# Patient Record
Sex: Female | Born: 1945 | Race: Black or African American | Hispanic: No | Marital: Married | Smoking: Current every day smoker
Health system: Southern US, Community
[De-identification: ages and names within clinical notes are randomized; demographics above are authoritative.]

## PROBLEM LIST (undated history)

## (undated) DIAGNOSIS — I10 Essential (primary) hypertension: Secondary | ICD-10-CM

## (undated) DIAGNOSIS — I639 Cerebral infarction, unspecified: Secondary | ICD-10-CM

## (undated) DIAGNOSIS — F419 Anxiety disorder, unspecified: Secondary | ICD-10-CM

## (undated) DIAGNOSIS — J449 Chronic obstructive pulmonary disease, unspecified: Secondary | ICD-10-CM

## (undated) DIAGNOSIS — K219 Gastro-esophageal reflux disease without esophagitis: Secondary | ICD-10-CM

## (undated) DIAGNOSIS — E119 Type 2 diabetes mellitus without complications: Secondary | ICD-10-CM

## (undated) DIAGNOSIS — E079 Disorder of thyroid, unspecified: Secondary | ICD-10-CM

## (undated) DIAGNOSIS — I2510 Atherosclerotic heart disease of native coronary artery without angina pectoris: Secondary | ICD-10-CM

## (undated) HISTORY — PX: CARDIAC SURGERY: SHX584

## (undated) HISTORY — DX: Gastro-esophageal reflux disease without esophagitis: K21.9

## (undated) HISTORY — DX: Disorder of thyroid, unspecified: E07.9

## (undated) HISTORY — DX: Cerebral infarction, unspecified: I63.9

## (undated) HISTORY — DX: Anxiety disorder, unspecified: F41.9

---

## 1997-08-29 ENCOUNTER — Emergency Department (HOSPITAL_COMMUNITY): Admission: EM | Admit: 1997-08-29 | Discharge: 1997-08-29 | Payer: Self-pay | Admitting: *Deleted

## 1997-12-27 ENCOUNTER — Encounter: Payer: Self-pay | Admitting: *Deleted

## 1997-12-27 ENCOUNTER — Ambulatory Visit (HOSPITAL_COMMUNITY): Admission: RE | Admit: 1997-12-27 | Discharge: 1997-12-27 | Payer: Self-pay | Admitting: *Deleted

## 1998-06-08 ENCOUNTER — Ambulatory Visit (HOSPITAL_COMMUNITY): Admission: RE | Admit: 1998-06-08 | Discharge: 1998-06-08 | Payer: Self-pay | Admitting: Cardiology

## 1998-06-08 ENCOUNTER — Encounter: Payer: Self-pay | Admitting: Cardiology

## 1998-06-12 ENCOUNTER — Ambulatory Visit (HOSPITAL_COMMUNITY): Admission: RE | Admit: 1998-06-12 | Discharge: 1998-06-12 | Payer: Self-pay | Admitting: *Deleted

## 1998-11-29 ENCOUNTER — Ambulatory Visit (HOSPITAL_COMMUNITY): Admission: RE | Admit: 1998-11-29 | Discharge: 1998-11-29 | Payer: Self-pay | Admitting: Cardiology

## 1998-12-06 ENCOUNTER — Ambulatory Visit (HOSPITAL_COMMUNITY): Admission: RE | Admit: 1998-12-06 | Discharge: 1998-12-06 | Payer: Self-pay | Admitting: Cardiology

## 1998-12-13 ENCOUNTER — Ambulatory Visit (HOSPITAL_COMMUNITY): Admission: RE | Admit: 1998-12-13 | Discharge: 1998-12-13 | Payer: Self-pay | Admitting: Cardiology

## 2000-12-02 ENCOUNTER — Ambulatory Visit (HOSPITAL_COMMUNITY): Admission: RE | Admit: 2000-12-02 | Discharge: 2000-12-02 | Payer: Self-pay | Admitting: Cardiology

## 2000-12-02 ENCOUNTER — Encounter: Payer: Self-pay | Admitting: Cardiology

## 2001-06-21 ENCOUNTER — Encounter: Payer: Self-pay | Admitting: Cardiology

## 2001-06-21 ENCOUNTER — Ambulatory Visit (HOSPITAL_COMMUNITY): Admission: RE | Admit: 2001-06-21 | Discharge: 2001-06-21 | Payer: Self-pay | Admitting: Cardiology

## 2001-07-06 ENCOUNTER — Ambulatory Visit (HOSPITAL_COMMUNITY): Admission: RE | Admit: 2001-07-06 | Discharge: 2001-07-06 | Payer: Self-pay | Admitting: Cardiology

## 2002-03-15 ENCOUNTER — Encounter: Admission: RE | Admit: 2002-03-15 | Discharge: 2002-03-15 | Payer: Self-pay | Admitting: Cardiology

## 2002-03-15 ENCOUNTER — Encounter: Payer: Self-pay | Admitting: Cardiology

## 2002-08-16 ENCOUNTER — Encounter: Payer: Self-pay | Admitting: Cardiology

## 2002-08-16 ENCOUNTER — Encounter: Admission: RE | Admit: 2002-08-16 | Discharge: 2002-08-16 | Payer: Self-pay | Admitting: Cardiology

## 2004-08-15 ENCOUNTER — Emergency Department (HOSPITAL_COMMUNITY): Admission: EM | Admit: 2004-08-15 | Discharge: 2004-08-15 | Payer: Self-pay | Admitting: Emergency Medicine

## 2004-10-24 ENCOUNTER — Ambulatory Visit (HOSPITAL_COMMUNITY): Admission: RE | Admit: 2004-10-24 | Discharge: 2004-10-24 | Payer: Self-pay | Admitting: Gastroenterology

## 2004-10-24 ENCOUNTER — Encounter (INDEPENDENT_AMBULATORY_CARE_PROVIDER_SITE_OTHER): Payer: Self-pay | Admitting: *Deleted

## 2005-03-31 HISTORY — PX: ANEURYSM COILING: SHX5349

## 2007-05-21 ENCOUNTER — Ambulatory Visit (HOSPITAL_COMMUNITY): Admission: RE | Admit: 2007-05-21 | Discharge: 2007-05-21 | Payer: Self-pay | Admitting: Cardiology

## 2008-06-05 ENCOUNTER — Inpatient Hospital Stay (HOSPITAL_BASED_OUTPATIENT_CLINIC_OR_DEPARTMENT_OTHER): Admission: RE | Admit: 2008-06-05 | Discharge: 2008-06-05 | Payer: Self-pay | Admitting: Cardiology

## 2008-12-19 ENCOUNTER — Encounter: Admission: RE | Admit: 2008-12-19 | Discharge: 2008-12-19 | Payer: Self-pay | Admitting: Family Medicine

## 2009-01-04 ENCOUNTER — Ambulatory Visit: Payer: Self-pay | Admitting: Internal Medicine

## 2009-01-04 ENCOUNTER — Inpatient Hospital Stay (HOSPITAL_COMMUNITY): Admission: EM | Admit: 2009-01-04 | Discharge: 2009-01-08 | Payer: Self-pay | Admitting: Emergency Medicine

## 2009-01-05 ENCOUNTER — Encounter (INDEPENDENT_AMBULATORY_CARE_PROVIDER_SITE_OTHER): Payer: Self-pay | Admitting: Emergency Medicine

## 2009-01-05 ENCOUNTER — Ambulatory Visit: Payer: Self-pay | Admitting: Vascular Surgery

## 2009-01-12 ENCOUNTER — Encounter: Payer: Self-pay | Admitting: Interventional Radiology

## 2009-02-14 ENCOUNTER — Inpatient Hospital Stay (HOSPITAL_COMMUNITY): Admission: AD | Admit: 2009-02-14 | Discharge: 2009-02-16 | Payer: Self-pay | Admitting: Interventional Radiology

## 2009-02-28 ENCOUNTER — Encounter: Payer: Self-pay | Admitting: Interventional Radiology

## 2009-05-01 ENCOUNTER — Encounter: Admission: RE | Admit: 2009-05-01 | Discharge: 2009-05-01 | Payer: Self-pay | Admitting: Family Medicine

## 2009-05-29 ENCOUNTER — Ambulatory Visit (HOSPITAL_COMMUNITY): Admission: RE | Admit: 2009-05-29 | Discharge: 2009-05-29 | Payer: Self-pay | Admitting: Interventional Radiology

## 2010-02-18 ENCOUNTER — Encounter (HOSPITAL_COMMUNITY)
Admission: RE | Admit: 2010-02-18 | Discharge: 2010-03-29 | Payer: Self-pay | Source: Home / Self Care | Attending: Endocrinology | Admitting: Endocrinology

## 2010-04-17 ENCOUNTER — Ambulatory Visit (HOSPITAL_COMMUNITY)
Admission: RE | Admit: 2010-04-17 | Discharge: 2010-04-17 | Payer: Self-pay | Source: Home / Self Care | Attending: Endocrinology | Admitting: Endocrinology

## 2010-06-23 LAB — BASIC METABOLIC PANEL
CO2: 29 mEq/L (ref 19–32)
Calcium: 9.2 mg/dL (ref 8.4–10.5)
Chloride: 104 mEq/L (ref 96–112)
GFR calc Af Amer: >60 mL/min (ref >60)
Potassium: 3.6 mEq/L (ref 3.5–5.1)
Sodium: 140 mEq/L (ref 135–145)

## 2010-06-23 LAB — APTT: aPTT: 40 seconds — ABNORMAL HIGH (ref 24–37)

## 2010-06-23 LAB — CBC
HCT: 31.5 % — ABNORMAL LOW (ref 36.0–46.0)
Platelets: 237 10*3/uL (ref 150–400)
WBC: 6.5 10*3/uL (ref 4.0–10.5)

## 2010-06-23 LAB — PROTIME-INR: INR: 0.97 (ref 0.00–1.49)

## 2010-06-23 LAB — GLUCOSE, CAPILLARY: Glucose-Capillary: 121 mg/dL — ABNORMAL HIGH (ref 70–99)

## 2010-07-03 LAB — GLUCOSE, CAPILLARY
Glucose-Capillary: 129 mg/dL — ABNORMAL HIGH (ref 70–99)
Glucose-Capillary: 141 mg/dL — ABNORMAL HIGH (ref 70–99)
Glucose-Capillary: 145 mg/dL — ABNORMAL HIGH (ref 70–99)
Glucose-Capillary: 149 mg/dL — ABNORMAL HIGH (ref 70–99)
Glucose-Capillary: 245 mg/dL — ABNORMAL HIGH (ref 70–99)
Glucose-Capillary: 373 mg/dL — ABNORMAL HIGH (ref 70–99)

## 2010-07-03 LAB — BASIC METABOLIC PANEL
BUN: 10 mg/dL (ref 6–23)
Calcium: 8.1 mg/dL — ABNORMAL LOW (ref 8.4–10.5)
Chloride: 103 mEq/L (ref 96–112)
Creatinine, Ser: 0.52 mg/dL (ref 0.4–1.2)
Creatinine, Ser: 0.65 mg/dL (ref 0.4–1.2)
GFR calc Af Amer: >60 mL/min (ref >60)
GFR calc non Af Amer: >60 mL/min (ref >60)
Glucose, Bld: 111 mg/dL — ABNORMAL HIGH (ref 70–99)
Potassium: 4.8 mEq/L (ref 3.5–5.1)
Sodium: 137 mEq/L (ref 135–145)

## 2010-07-03 LAB — CBC
HCT: 37.5 % (ref 36.0–46.0)
Hemoglobin: 9.7 g/dL — ABNORMAL LOW (ref 12.0–15.0)
MCHC: 33.6 g/dL (ref 30.0–36.0)
MCV: 84.2 fL (ref 78.0–100.0)
Platelets: 180 10*3/uL (ref 150–400)
Platelets: 214 10*3/uL (ref 150–400)
RBC: 3.42 MIL/uL — ABNORMAL LOW (ref 3.87–5.11)
RBC: 3.54 MIL/uL — ABNORMAL LOW (ref 3.87–5.11)
RDW: 16.7 % — ABNORMAL HIGH (ref 11.5–15.5)
RDW: 16.6 % — ABNORMAL HIGH (ref 11.5–15.5)
WBC: 7.2 10*3/uL (ref 4.0–10.5)

## 2010-07-03 LAB — APTT: aPTT: 56 seconds — ABNORMAL HIGH (ref 24–37)

## 2010-07-03 LAB — DIFFERENTIAL
Basophils Absolute: 0.1 10*3/uL (ref 0.0–0.1)
Basophils Relative: 1 % (ref 0–1)
Eosinophils Absolute: 0.2 10*3/uL (ref 0.0–0.7)
Eosinophils Relative: 3 % (ref 0–5)
Lymphs Abs: 4.1 10*3/uL — ABNORMAL HIGH (ref 0.7–4.0)
Neutrophils Relative %: 34 % — ABNORMAL LOW (ref 43–77)

## 2010-07-03 LAB — PROTIME-INR
INR: 0.97 (ref 0.00–1.49)
Prothrombin Time: 12.8 seconds (ref 11.6–15.2)

## 2010-07-03 LAB — HEPARIN LEVEL (UNFRACTIONATED): Heparin Unfractionated: 0.21 IU/mL — ABNORMAL LOW (ref 0.30–0.70)

## 2010-07-04 LAB — CBC
HCT: 36.7 % (ref 36.0–46.0)
HCT: 37.6 % (ref 36.0–46.0)
HCT: 38 % (ref 36.0–46.0)
Hemoglobin: 12.4 g/dL (ref 12.0–15.0)
MCHC: 33.1 g/dL (ref 30.0–36.0)
MCHC: 33.1 g/dL (ref 30.0–36.0)
MCHC: 33.2 g/dL (ref 30.0–36.0)
MCV: 84.4 fL (ref 78.0–100.0)
MCV: 84.8 fL (ref 78.0–100.0)
MCV: 85 fL (ref 78.0–100.0)
MCV: 85.1 fL (ref 78.0–100.0)
Platelets: 199 10*3/uL (ref 150–400)
Platelets: 208 10*3/uL (ref 150–400)
RBC: 4.31 MIL/uL (ref 3.87–5.11)
RBC: 4.38 MIL/uL (ref 3.87–5.11)
RDW: 16.4 % — ABNORMAL HIGH (ref 11.5–15.5)
RDW: 16.6 % — ABNORMAL HIGH (ref 11.5–15.5)
RDW: 16.7 % — ABNORMAL HIGH (ref 11.5–15.5)

## 2010-07-04 LAB — URINALYSIS, ROUTINE W REFLEX MICROSCOPIC
Bilirubin Urine: NEGATIVE
Ketones, ur: NEGATIVE mg/dL
Nitrite: NEGATIVE
pH: 6.5 (ref 5.0–8.0)

## 2010-07-04 LAB — DIFFERENTIAL
Basophils Absolute: 0 10*3/uL (ref 0.0–0.1)
Eosinophils Relative: 3 % (ref 0–5)
Lymphocytes Relative: 51 % — ABNORMAL HIGH (ref 12–46)
Neutro Abs: 2.6 10*3/uL (ref 1.7–7.7)

## 2010-07-04 LAB — BASIC METABOLIC PANEL
BUN: 9 mg/dL (ref 6–23)
CO2: 31 mEq/L (ref 19–32)
CO2: 32 mEq/L (ref 19–32)
Chloride: 101 mEq/L (ref 96–112)
Chloride: 104 mEq/L (ref 96–112)
Creatinine, Ser: 0.71 mg/dL (ref 0.4–1.2)
GFR calc Af Amer: >60 mL/min (ref >60)
Glucose, Bld: 121 mg/dL — ABNORMAL HIGH (ref 70–99)
Potassium: 3.9 mEq/L (ref 3.5–5.1)
Potassium: 4 mEq/L (ref 3.5–5.1)
Sodium: 139 mEq/L (ref 135–145)

## 2010-07-04 LAB — COMPREHENSIVE METABOLIC PANEL
ALT: 14 U/L (ref 0–35)
AST: 16 U/L (ref 0–37)
Albumin: 4 g/dL (ref 3.5–5.2)
Alkaline Phosphatase: 105 U/L (ref 39–117)
BUN: 7 mg/dL (ref 6–23)
BUN: 7 mg/dL (ref 6–23)
CO2: 28 mEq/L (ref 19–32)
Chloride: 100 mEq/L (ref 96–112)
Chloride: 102 mEq/L (ref 96–112)
Creatinine, Ser: 0.64 mg/dL (ref 0.4–1.2)
GFR calc non Af Amer: >60 mL/min (ref >60)
Glucose, Bld: 140 mg/dL — ABNORMAL HIGH (ref 70–99)
Potassium: 4.2 mEq/L (ref 3.5–5.1)
Total Bilirubin: 0.4 mg/dL (ref 0.3–1.2)
Total Bilirubin: 0.6 mg/dL (ref 0.3–1.2)

## 2010-07-04 LAB — GLUCOSE, CAPILLARY
Glucose-Capillary: 113 mg/dL — ABNORMAL HIGH (ref 70–99)
Glucose-Capillary: 116 mg/dL — ABNORMAL HIGH (ref 70–99)
Glucose-Capillary: 116 mg/dL — ABNORMAL HIGH (ref 70–99)
Glucose-Capillary: 117 mg/dL — ABNORMAL HIGH (ref 70–99)
Glucose-Capillary: 128 mg/dL — ABNORMAL HIGH (ref 70–99)
Glucose-Capillary: 136 mg/dL — ABNORMAL HIGH (ref 70–99)
Glucose-Capillary: 139 mg/dL — ABNORMAL HIGH (ref 70–99)
Glucose-Capillary: 144 mg/dL — ABNORMAL HIGH (ref 70–99)
Glucose-Capillary: 147 mg/dL — ABNORMAL HIGH (ref 70–99)
Glucose-Capillary: 147 mg/dL — ABNORMAL HIGH (ref 70–99)
Glucose-Capillary: 175 mg/dL — ABNORMAL HIGH (ref 70–99)

## 2010-07-04 LAB — LIPID PANEL
Total CHOL/HDL Ratio: 2.3 RATIO
VLDL: 13 mg/dL (ref 0–40)

## 2010-07-04 LAB — TROPONIN I: Troponin I: 0.02 ng/mL (ref 0.00–0.06)

## 2010-07-04 LAB — APTT: aPTT: 35 seconds (ref 24–37)

## 2010-07-04 LAB — PROTIME-INR
INR: 0.99 (ref 0.00–1.49)
Prothrombin Time: 12.9 seconds (ref 11.6–15.2)

## 2010-08-07 ENCOUNTER — Other Ambulatory Visit: Payer: Self-pay | Admitting: Family Medicine

## 2010-08-07 DIAGNOSIS — Z1231 Encounter for screening mammogram for malignant neoplasm of breast: Secondary | ICD-10-CM

## 2010-08-13 NOTE — Cardiovascular Report (Signed)
NAME:  Sheryl Atkins, Sheryl Atkins            ACCOUNT NO.:  [id number]   MEDICAL RECORD NO.:  [id number]          PATIENT TYPE:  OIB   LOCATION:  1961                         FACILITY:  [facility]   PHYSICIAN:  Mohan N. Sharyn Lull, M.D. DATE OF BIRTH:  Oct 20, 1945   DATE OF PROCEDURE:  06/05/2008  DATE OF DISCHARGE:  06/05/2008                            CARDIAC CATHETERIZATION   PROCEDURE:  Left cardiac catheterization with selective left and right  coronary angiography, left ventriculography via right groin using  Judkins technique.   INDICATIONS FOR PROCEDURE:  Sheryl Atkins is a 65 year old black female  with past medical history significant for coronary artery disease,  hypertension, non-insulin-dependent diabetes mellitus,  hypercholesteremia, morbid obesity, depression, complains of  retrosternal chest pressure associated with minimal exertion and  relieved with sublingual nitro.  Denies any nausea, vomiting, or  diaphoresis.  Denies palpitation, lightheadedness, or syncope.  Denies  PND, orthopnea, or leg swelling.  The patient underwent Persantine  Myoview approximately 1 year ago in February 2009, which showed minor ST-  T wave inversion in flow and inferior leads, but Myoview scan was  negative for ischemia, but due to typical recurrent chest pain and minor  EKG changes, discussed with the patient regarding left cath, its risks  and benefits, i.e., death, MI, stroke, need for emergency CABG, risk of  restenosis, local vascular complications, etc., and consented for the  procedure.   PROCEDURE:  After obtaining informed consent, the patient was brought to  the cath lab and was placed on fluoroscopy table.  The right groin was  prepped and draped in usual fashion.  Xylocaine 2% was used for local  anesthesia in the right groin.  With the help of thin-wall needle, 4-  French arterial sheath was placed.  The sheath was aspirated and  flushed.  Next, 4-French left Judkins catheter was advanced  over the  wire under fluoroscopic guidance into the ascending aorta.  Wire was  pulled out.  The catheter was aspirated and connected to the manifold.  Catheter was further advanced and engaged into left coronary ostium.  Multiple views of the left system were taken.  Next, catheter was  disengaged and was pulled out over the wire and was replaced with 4-  [place] 3-D right diagnostic catheter, which was advanced over the wire  under fluoroscopic guidance up to the ascending aorta.  Wire was pulled  out.  The catheter was aspirated and connected to the manifold.  Catheter was further advanced and engaged into right coronary ostium.  Multiple views of the right system were taken.  Next, the catheter was  disengaged and was pulled out over the wire and was replaced with 4-  French pigtail catheter, which was advanced over the wire under  fluoroscopic guidance to the ascending aorta.  Catheter was further  advanced across the aortic valve into the LV.  LV pressures were  recorded.  Next, LV graphy was done in 30-degree RAO position.  Postangiographic pressures were recorded from LV and then pullback  pressures were recorded from the aorta.  There was no gradient across  the aortic valve.  Next, the pigtail  catheter was pulled out over the  wire.  Sheaths were aspirated and flushed.   FINDINGS:  LV showed good LV systolic function, LVH, EF of 16-10%.  Left  main has 20-25% distal stenosis.  LAD has 15-20% ostial stenosis.  Diagonal one is small, which is patent.  Diagonal 2 is very small, which  is patent.  Ramus is moderate size, which has 10-15% stenosis.  Left  circumflex is small, which is patent.  OM1 and OM2  were very very small.  RCA was small, which was patent.  PDA and PLV  branches were small, which were patent.  The patient tolerated the  procedure well.  There were no complications.  The patient was  transferred to recovery room in stable condition.      [name]. Sharyn Lull,  M.D.  Electronically Signed     MNH/MEDQ  D:  06/05/2008  T:  06/05/2008  Job:  [id number]   cc:   Cath Lab

## 2010-08-14 ENCOUNTER — Ambulatory Visit: Payer: Self-pay

## 2010-08-16 NOTE — Cardiovascular Report (Signed)
[place]  Patient:    Sheryl Atkins, Sheryl Atkins Visit Number: [id number] MRN: [id number]          Service Type: CAT Location: [facility] 28[address] [id number] Attending Physician:  Robynn Pane Dictated by:   [name] Sharyn Lull, M.D. Proc. Date: 07/06/01 Admit Date:  07/06/2001 Discharge Date: 07/06/2001                          Cardiac Catheterization  PROCEDURES: 1. Left cardiac catheterization. 2. Selective left and right coronary angiography. 3. Left ventriculography using right groin using Judkins technique.  INDICATIONS FOR PROCEDURE:  Sheryl Atkins is a 65 year old black female with past medical history significant for coronary artery disease, history of hypertension, noninsulin dependent diabetes mellitus, hypercholesterolemia. She complains of retrosternal chest tightness off and on, associated with exertion and relieved with rest and sublingual nitroglycerin. Denies any nausea, vomiting or diaphoresis.  Denies PND, orthopnea, leg swelling.  Denies rest or nocturnal angina.  States chest tightness occurs while in the garden, and relieved with rest.  The patient underwent a Persantine Cardiolite on June 21, 2001, which showed mild reversible ischemia in the anteroapical segment, and an EF of 67%.  The patient had similar findings in September 2002, and opted for medical management initially, but due to recurrent chest pain and multiple risk factors, and abnormal stress test, the patient consented for PCI.  PAST MEDICAL HISTORY:  As above.  PAST SURGICAL HISTORY:  She had back surgery in 1989.  ALLERGIES:  She has intolerance to DARVOCET.  HOME MEDICATIONS: 1. Avalide 300/12.5 mg p.o. q.d. 2. Toprol XL 50 mg p.o. q.d. 3. Norvasc 5 mg p.o. q.d. 4. Pravachol 20 mg p.o. q.h.s. 5. Nexium 40 mg p.o. q.d. 6. Nitro-Dur 0.25 mg per hr; applied to chest wall in the a.m. and    off at night. 7. Glucotrol XL 10 mg p.o. q.d. 8. Enteric-coated aspirin 325 mg  p.o. q.d.  SOCIAL HISTORY:  She is married and has three children.  She smoked two packs per day for 12 years; quit 30+ years ago.  No history of alcohol abuse.  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  Born in [place], lives in [place] since [place].  FAMILY HISTORY:  Positive for coronary artery disease.  PHYSICAL EXAMINATION:  GENERAL:  She was alert, oriented x3; in no acute distress.  VITAL SIGNS:  Blood pressure 130/70, pulse 76 and regular.  NECK:  Supple no JVD and no bruits.  LUNGS:  Clear to auscultation without rhonchi or rales.  CARDIOVASCULAR:  S1, S2 normal.  There was no S3 gallop or murmur.  ABDOMEN:  Soft, bowel sounds are present.  Nontender.  EXTREMITIES:  There is no clubbing, cyanosis or edema.  IMPRESSION:  Accelerated angina, positive Persantine Cardiolite, hypertension, noninsulin-dependent diabetes mellitus, hypercholesterolemia, CAD, COPD, CA in the past many years ago, history of tobacco abuse.  Discussed at length with the patient regarding stress test findings and what various options of treatment (i.e., invasive versus medical) its risks and benefits (i.e., death, MI, stroke, need for emergency CABG, risk of restenosis, local vascular complications).  The patient consented for PCI.   DESCRIPTION OF PROCEDURE:  After obtaining the informed consent, the patient was brought to the catheterization lab and was placed on the fluoroscopy table.  The right groin was prepped and draped in the usual fashion. Xylocaine 2% was used for local anesthesia in the right groin.  With the help  of a thin-walled needle, 6-French arterial sheath was placed.  The sheath was aspirated and flushed.  Next, 6-French left Judkins catheter was advanced over the wire under fluoroscopic guidance, up to the ascending aorta.  The wire was pulled out. The catheter was aspirated and connected to the manifold.  The catheter was further advanced and engaged into the left  coronary ostium.  Multiple views of the left system were taken.  Next, the catheter was disengaged and was pulled out over the wire  was replaced with 6-French right Judkins catheter, which was advanced over the wire under fluoroscopic guidance up to the ascending aorta.  The wire was pulled out.  The catheter was aspirated and connected to the manifold. The catheter was further advanced and engaged into the right coronary ostium. Multiple views of the right system were taken.  Next, the catheter was disengaged and was pulled out over the wire, and was replaced with 6-French pigtail catheter.  This was advanced over the wire under fluoroscopic guidance up to the ascending aorta.  The catheter was further advanced across the aortic valve into the LV.  The LV pressures were recorded.  Next, left ventriculography was done in 30-degree RAO position. Post-angiographic pressures were recorded from LV and then pullback pressures were recorded from the aorta.  There was no gradient across the aortic valve.  Next, the pigtail catheter was pulled out over the wire.  Sheaths were aspirated and flushed.  Arteriotomy was closed with Perclose without any complications.  The patient tolerated the procedure well.  FINDINGS:  LEFT VENTRICULOGRAM:  Showed good LV systolic function, with EF of 08-65%.  CORONARIES: 1. LEFT MAIN:  Patent. 2. LEFT ANTERIOR DESCENDING:  Patent.  The first diagonal has 10-15% ostial    stenosis.  The second diagonal branch to the fourth diagonal were very,    very small. 3. RAMUS INTERMEDIATE:  Large and patent.  This bifurcates into two superior    and inferior branches, which were also patent. 4. LEFT CIRCUMFLEX:  Patent proximally and then tapers down in AV groove    after OM2.  OM1 was very small, which was diffusely diseased.  OM2 was    diffusely diseased distally and is a medium-sized vessel. Sheryl Atkins was patent.  The patient tolerated the procedure well and was  transferred to the recovery room in stable condition.  Dictated by:   [name] Sharyn Lull, M.D. Attending Physician:  Robynn Pane DD:  07/06/01 TD:  07/06/01 Job: [id number]

## 2010-09-25 ENCOUNTER — Ambulatory Visit: Payer: Self-pay

## 2011-03-26 ENCOUNTER — Other Ambulatory Visit: Payer: Self-pay | Admitting: Cardiology

## 2011-04-15 ENCOUNTER — Other Ambulatory Visit: Payer: Self-pay | Admitting: Cardiology

## 2011-04-22 ENCOUNTER — Other Ambulatory Visit: Payer: Self-pay | Admitting: Cardiology

## 2011-06-26 IMAGING — XA IR ANGIO/CAROTID/CERV BI
1 series · 13 of 24 positions shown · IV contrast (IODINE)
Comparison: MRI/MRA of the brain of 01/04/2009.

CLINICAL DATA: History of right-sided numbness and weakness due to
right inferior cerebellar peduncle ischemia.  Also left MCA region
aneurysm on MRA examination.

BILATERAL CAROTID ARTERIOGRAPHY AND BILATERAL VERTEBRAL ARTERY
ANGIOGRAMS

[Series 300: sp angio/car/cere bi · 13 of 186 slices shown]
[im 1/186]
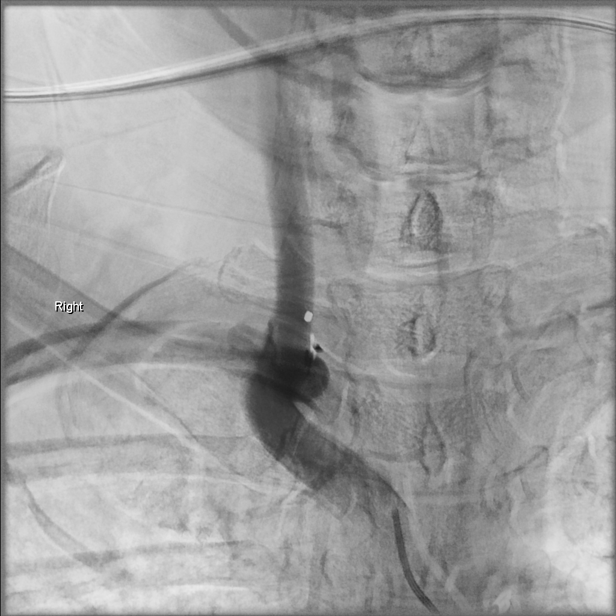
[im 17/186]
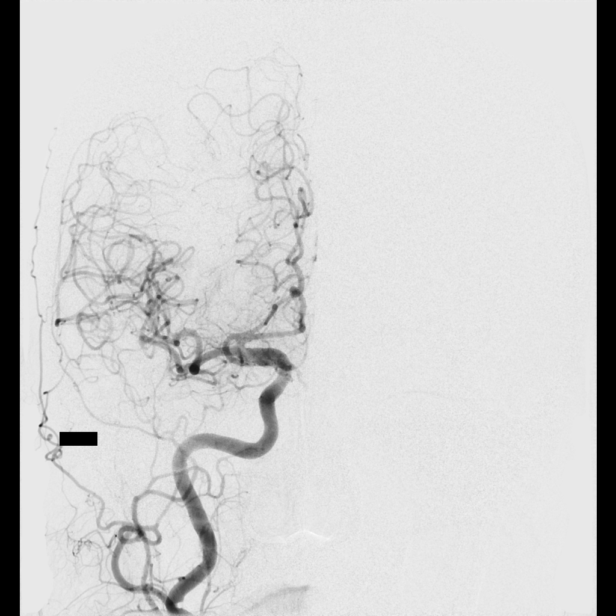
[im 33/186]
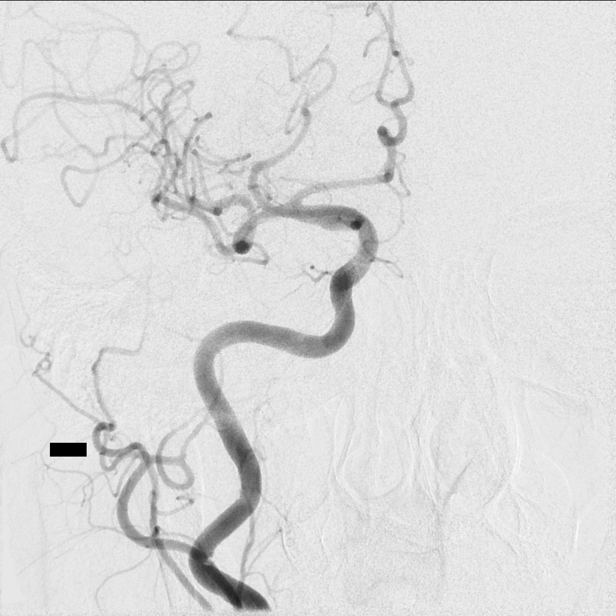
[im 49/186]
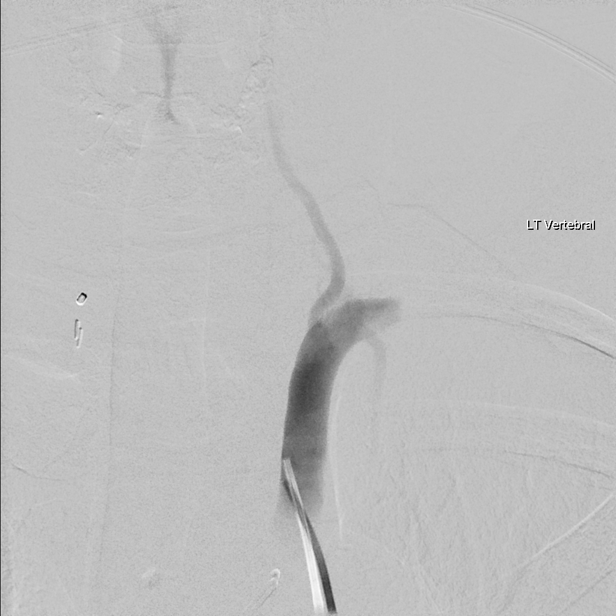
[im 65/186]
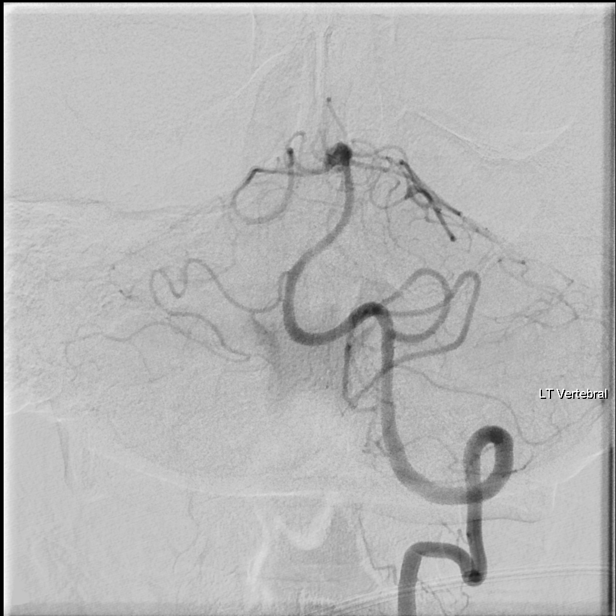
[im 81/186]
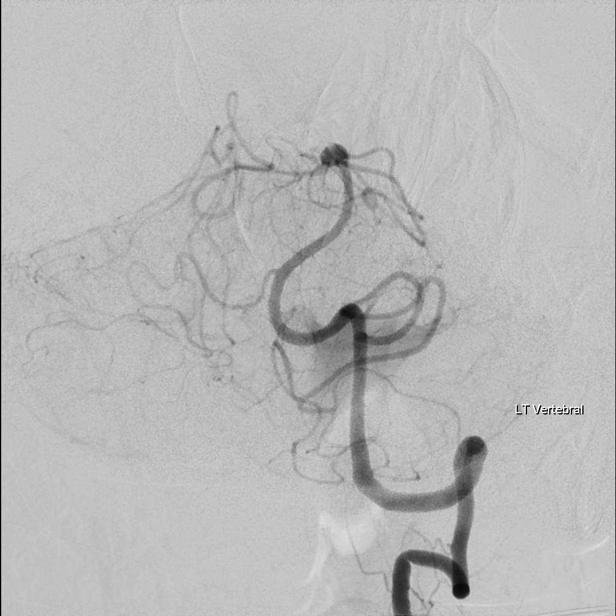
[im 97/186]
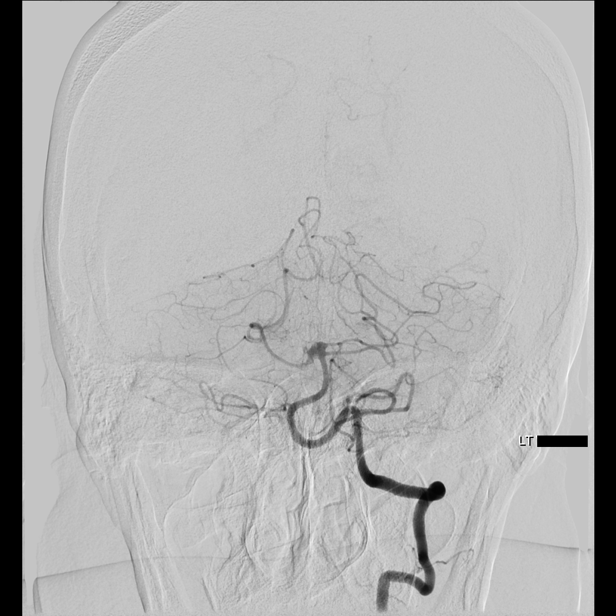
[im 105/186]
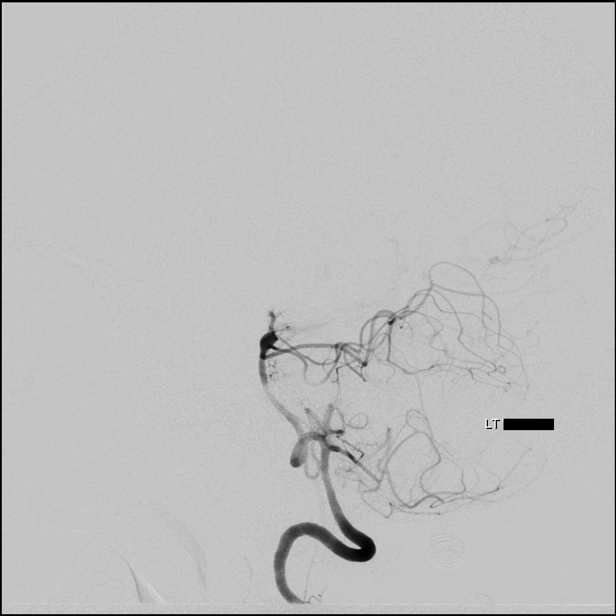
[im 121/186]
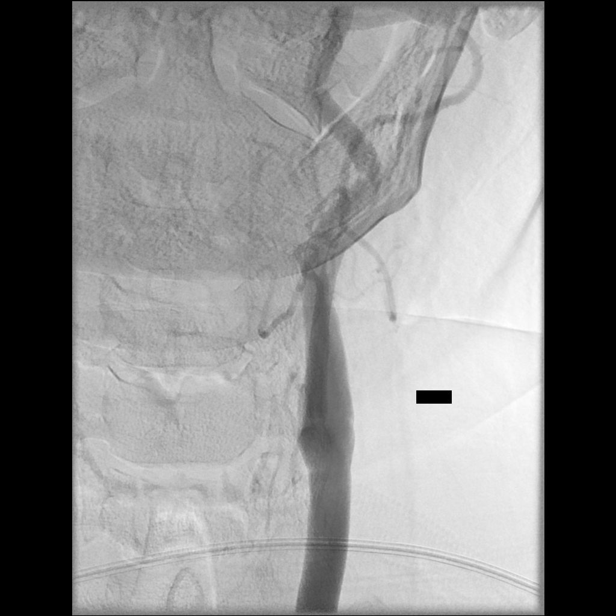
[im 137/186]
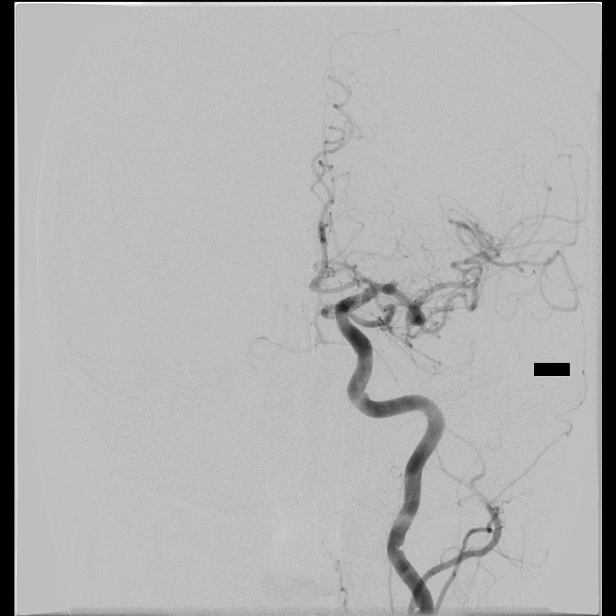
[im 153/186]
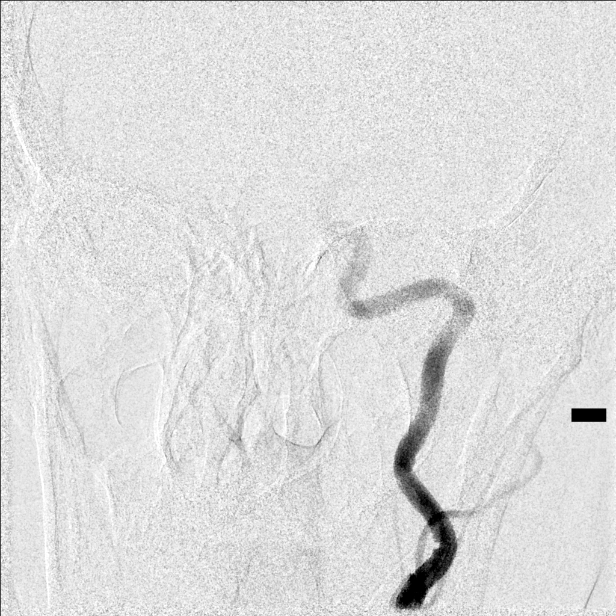
[im 169/186]
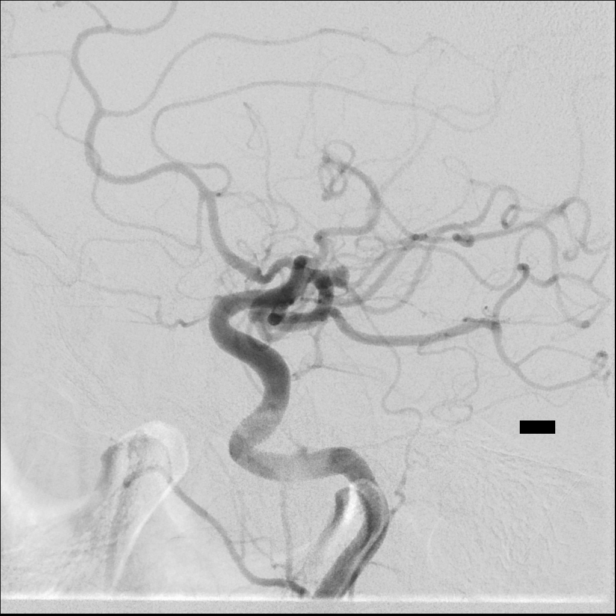
[im 186/186]
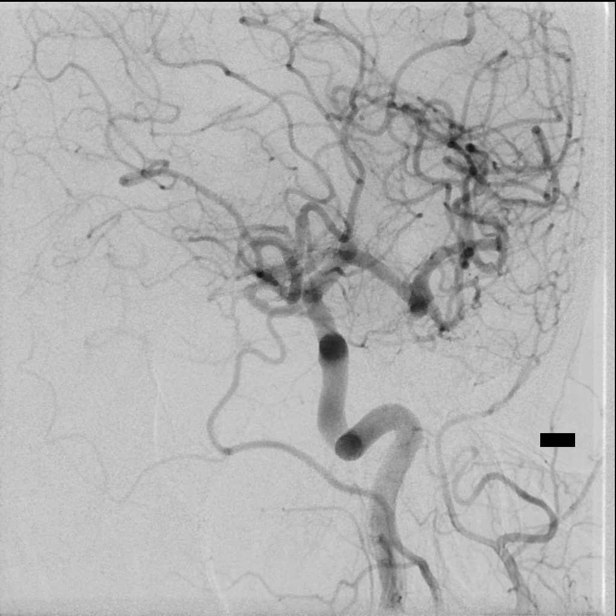

[13 of 24 positions shown; findings below may reference images not displayed]

Following a full explanation of the procedure along with the
potential associated complications, an informed witnessed consent
was obtained.

The right groin was prepped and draped in the usual sterile
fashion.  Thereafter using a modified Seldinger technique,
transfemoral access into the right common femoral artery was
obtained without difficulty.  Over a 0.035 inch guidewire, a 5-
French Pinnacle sheath was inserted.  Through this and also over a
0.035-inch guidewire, a 5-French JB1 catheter was advanced to the
aortic arch region and selectively positioned in the right common
carotid artery, the right subclavian artery, the left common
carotid artery and the left vertebral artery.

There were no acute complications.  The patient tolerated the
procedure well.

Medications utilized: Versed 1 mg IV.  Fentanyl 25 mcg IV.

Contrast: Xmnipaque-KKK 65 ml.
FINDINGS: The right common carotid arteriogram demonstrates the
right external carotid artery and its major branches to be normal.

The right internal carotid artery at the bulb to the cranial skull
base is normally opacified.  The petrous and the cavernous segments
are normally opacified.  There is mild fusiform dilatation of the
supraclinoid right ICA.

The right middle and the right anterior cerebral arteries are seen
to opacify normally into capillary and venous phases.  A dominant
right posterior communicating artery is seen opacifying the right
posterior cerebral and superior cerebellar artery distributions.

The right subclavian arteriogram demonstrates a hypoplastic right
vertebral artery with a normal origin.  The vessel is seen to
opacify normally to the cranial skull base where it terminates in
the ipsilateral right posterior-inferior cerebellar artery.

The left vertebral artery origin is normal.  The vessel is seen to
opacify normally to the cranial skull base.  There is normal
opacification of the left posterior inferior cerebellar artery and
the left vertebrobasilar junction.

The basilar artery, the superior cerebellar arteries, the anterior
inferior cerebellar arteries are seen to opacify normally into
capillary and venous phases.

Transient opacification distal to the superior cerebellar arteries
is noted of both the posterior cerebral arteries.

Mixing with dominant unopacified blood is seen in this region.

Arising at the level of the superior cerebellar arteries is a
saccular aneurysm projecting superiorly, posteriorly and measures
approximately 4.7 mm x 4.4 mm.

The left common carotid arteriogram demonstrates the left external
carotid artery and its major branches to be normal.  The left
internal carotid artery at the bulb to the cranial skull base is
normally opacified.

The petrous, the cavernous and the supraclinoid segments are
normally opacified.

A dominant left posterior communicating artery is seen opacifying
the left posterior cerebral and superior cerebellar artery
distributions.

The left middle and the left anterior cerebral arteries are seen to
opacify normally into capillary and venous phases. There is a
saccular aneurysm arising from the left mid M1 segment projecting
posteriorly and superiorly which measures approximately 4.5 mm x 4
mm.
IMPRESSION: 1.  Approximately 4.5 mm x 4 mm left middle cerebral artery M1
trunk aneurysm.
2.  Approximately 4.7 mm x 4.4 mm distal basilar artery aneurysm
arising at the level of the superior cerebellar arteries.

## 2014-12-10 ENCOUNTER — Emergency Department (HOSPITAL_COMMUNITY): Payer: No Typology Code available for payment source

## 2014-12-10 ENCOUNTER — Inpatient Hospital Stay (HOSPITAL_COMMUNITY)
Admission: EM | Admit: 2014-12-10 | Discharge: 2014-12-10 | DRG: 303 | Discharge status: Against Medical Advice | Payer: No Typology Code available for payment source | Attending: Cardiology | Admitting: Cardiology

## 2014-12-10 ENCOUNTER — Encounter (HOSPITAL_COMMUNITY): Payer: Self-pay

## 2014-12-10 ENCOUNTER — Other Ambulatory Visit: Payer: Self-pay

## 2014-12-10 DIAGNOSIS — R079 Chest pain, unspecified: Secondary | ICD-10-CM

## 2014-12-10 DIAGNOSIS — M25572 Pain in left ankle and joints of left foot: Secondary | ICD-10-CM | POA: Diagnosis present

## 2014-12-10 DIAGNOSIS — M542 Cervicalgia: Secondary | ICD-10-CM | POA: Diagnosis present

## 2014-12-10 DIAGNOSIS — R110 Nausea: Secondary | ICD-10-CM | POA: Diagnosis present

## 2014-12-10 DIAGNOSIS — Z8673 Personal history of transient ischemic attack (TIA), and cerebral infarction without residual deficits: Secondary | ICD-10-CM | POA: Diagnosis not present

## 2014-12-10 DIAGNOSIS — R51 Headache: Secondary | ICD-10-CM | POA: Diagnosis present

## 2014-12-10 DIAGNOSIS — E119 Type 2 diabetes mellitus without complications: Secondary | ICD-10-CM | POA: Diagnosis present

## 2014-12-10 DIAGNOSIS — Z79899 Other long term (current) drug therapy: Secondary | ICD-10-CM | POA: Diagnosis not present

## 2014-12-10 DIAGNOSIS — M25512 Pain in left shoulder: Secondary | ICD-10-CM | POA: Diagnosis present

## 2014-12-10 DIAGNOSIS — I10 Essential (primary) hypertension: Secondary | ICD-10-CM | POA: Diagnosis present

## 2014-12-10 DIAGNOSIS — Y92410 Unspecified street and highway as the place of occurrence of the external cause: Secondary | ICD-10-CM

## 2014-12-10 DIAGNOSIS — I25119 Atherosclerotic heart disease of native coronary artery with unspecified angina pectoris: Secondary | ICD-10-CM | POA: Diagnosis present

## 2014-12-10 DIAGNOSIS — E785 Hyperlipidemia, unspecified: Secondary | ICD-10-CM | POA: Diagnosis present

## 2014-12-10 DIAGNOSIS — F17210 Nicotine dependence, cigarettes, uncomplicated: Secondary | ICD-10-CM | POA: Diagnosis present

## 2014-12-10 DIAGNOSIS — J449 Chronic obstructive pulmonary disease, unspecified: Secondary | ICD-10-CM | POA: Diagnosis present

## 2014-12-10 DIAGNOSIS — I249 Acute ischemic heart disease, unspecified: Secondary | ICD-10-CM | POA: Diagnosis present

## 2014-12-10 HISTORY — DX: Atherosclerotic heart disease of native coronary artery without angina pectoris: I25.10

## 2014-12-10 HISTORY — DX: Essential (primary) hypertension: I10

## 2014-12-10 HISTORY — DX: Type 2 diabetes mellitus without complications: E11.9

## 2014-12-10 HISTORY — DX: Cerebral infarction, unspecified: I63.9

## 2014-12-10 HISTORY — DX: Chronic obstructive pulmonary disease, unspecified: J44.9

## 2014-12-10 LAB — CBC
HCT: 38.2 % (ref 36.0–46.0)
HEMOGLOBIN: 13 g/dL (ref 12.0–15.0)
MCH: 28.1 pg (ref 26.0–34.0)
MCHC: 34 g/dL (ref 30.0–36.0)
MCV: 82.7 fL (ref 78.0–100.0)
PLATELETS: 147 10*3/uL — AB (ref 150–400)
RBC: 4.62 MIL/uL (ref 3.87–5.11)
RDW: 17 % — ABNORMAL HIGH (ref 11.5–15.5)
WBC: 7.6 10*3/uL (ref 4.0–10.5)

## 2014-12-10 LAB — BASIC METABOLIC PANEL
ANION GAP: 7 (ref 5–15)
BUN: 10 mg/dL (ref 6–20)
CALCIUM: 9.5 mg/dL (ref 8.9–10.3)
CHLORIDE: 103 mmol/L (ref 101–111)
CO2: 29 mmol/L (ref 22–32)
CREATININE: 0.73 mg/dL (ref 0.44–1.00)
GFR calc non Af Amer: >60 mL/min (ref >60)
Glucose, Bld: 166 mg/dL — ABNORMAL HIGH (ref 65–99)
Potassium: 4.4 mmol/L (ref 3.5–5.1)
SODIUM: 139 mmol/L (ref 135–145)

## 2014-12-10 LAB — I-STAT TROPONIN, ED: TROPONIN I, POC: 0 ng/mL (ref 0.00–0.08)

## 2014-12-10 MED ORDER — ASPIRIN 81 MG PO CHEW
324.0000 mg | CHEWABLE_TABLET | ORAL | Status: AC
  Administered 2014-12-10: 324 mg via ORAL
  Filled 2014-12-10: qty 4

## 2014-12-10 MED ORDER — NITROGLYCERIN 0.4 MG SL SUBL: 0.4000 mg | SUBLINGUAL_TABLET | SUBLINGUAL | Status: DC | PRN

## 2014-12-10 MED ORDER — AMLODIPINE BESYLATE 5 MG PO TABS
5.0000 mg | ORAL_TABLET | Freq: Once | ORAL | Status: AC
  Administered 2014-12-10: 5 mg via ORAL
  Filled 2014-12-10: qty 1

## 2014-12-10 MED ORDER — CYCLOBENZAPRINE HCL 10 MG PO TABS
5.0000 mg | ORAL_TABLET | Freq: Once | ORAL | Status: AC
  Administered 2014-12-10: 5 mg via ORAL
  Filled 2014-12-10: qty 1

## 2014-12-10 MED ORDER — ASPIRIN EC 81 MG PO TBEC: 81.0000 mg | DELAYED_RELEASE_TABLET | Freq: Every day | ORAL | Status: DC

## 2014-12-10 MED ORDER — HEPARIN (PORCINE) IN NACL 100-0.45 UNIT/ML-% IJ SOLN
950.0000 [IU]/h | INTRAMUSCULAR | Status: DC
  Filled 2014-12-10: qty 250

## 2014-12-10 MED ORDER — HEPARIN BOLUS VIA INFUSION
4000.0000 [IU] | Freq: Once | INTRAVENOUS | Status: DC
  Filled 2014-12-10: qty 4000

## 2014-12-10 MED ORDER — ONDANSETRON HCL 4 MG/2ML IJ SOLN
4.0000 mg | Freq: Four times a day (QID) | INTRAMUSCULAR | Status: DC | PRN
Start: 2014-12-10 — End: 2014-12-11

## 2014-12-10 MED ORDER — ACETAMINOPHEN 325 MG PO TABS: 650.0000 mg | ORAL_TABLET | ORAL | Status: DC | PRN

## 2014-12-10 MED ORDER — ASPIRIN 300 MG RE SUPP: 300.0000 mg | RECTAL | Status: AC

## 2014-12-10 NOTE — ED Notes (Signed)
MD made aware of BP and patient made aware of the risks.

## 2014-12-10 NOTE — ED Notes (Signed)
Cardiology at the bedside speaking with patient. Phlebotomy stated that patient said she wanted to go home and she was not going to stay.
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
## 2014-12-10 NOTE — ED Notes (Signed)
Patient sitting on the bedside. Patient dressed and ready to go.

## 2014-12-10 NOTE — ED Notes (Signed)
Patient spoke with cardiologist and agreed that she would sign out AMA. MD reviewed risks of signing out and patient agreed to come back tomorrow.
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
## 2014-12-10 NOTE — ED Notes (Signed)
MD at the bedside  

## 2014-12-10 NOTE — Progress Notes (Signed)
ANTICOAGULATION CONSULT NOTE - Initial Consult  Pharmacy Consult for Heparin Indication: chest pain/ACS  No Known Allergies  Patient Measurements: Height: 5\' 4"  (162.6 cm) Weight: 205 lb (92.987 kg) IBW/kg (Calculated) : 54.7 Heparin Dosing Weight: 76 kg  Vital Signs: Temp: 99 F (37.2 C) (09/11 1831) Temp Source: Oral (09/11 1831) BP: 190/74 mmHg (09/11 2145) Pulse Rate: 51 (09/11 2200)  Labs:  Recent Labs  12/10/14 1905  HGB 13.0  HCT 38.2  PLT 147*  CREATININE 0.73    Estimated Creatinine Clearance: 72.3 mL/min (by C-G formula based on Cr of 0.73).   Medical History: Past Medical History  Diagnosis Date  . Stroke   . Diabetes mellitus without complication   . Hypertension   . COPD (chronic obstructive pulmonary disease)   . Coronary artery disease     Medications:  Awaiting med rec  Assessment: 69 y.o. female presents with CP. To begin heparin for r/o ACS. H/H stable on admission, plt slightly low at 147.   Goal of Therapy:  Heparin level 0.3-0.7 units/ml Monitor platelets by anticoagulation protocol: Yes   Plan:  Heparin IV bolus 4000 units Heparin gtt at 950 units/hr Will f/u heparin level in hours Daily heparin level and CBC  Sherlon Handing, PharmD, BCPS Clinical pharmacist, pager [phone] 12/10/2014,11:02 PM

## 2014-12-10 NOTE — ED Notes (Signed)
Patient returned from [place] and CT.

## 2014-12-15 ENCOUNTER — Inpatient Hospital Stay (HOSPITAL_COMMUNITY)
Admission: AD | Admit: 2014-12-15 | Discharge: 2014-12-18 | DRG: 287 | Disposition: A | Discharge status: Home / Self Care | Payer: Medicare Other | Source: Ambulatory Visit | Attending: Cardiovascular Disease | Admitting: Cardiovascular Disease

## 2014-12-15 DIAGNOSIS — I25110 Atherosclerotic heart disease of native coronary artery with unstable angina pectoris: Principal | ICD-10-CM | POA: Diagnosis present

## 2014-12-15 DIAGNOSIS — R079 Chest pain, unspecified: Secondary | ICD-10-CM | POA: Diagnosis present

## 2014-12-15 DIAGNOSIS — Z8673 Personal history of transient ischemic attack (TIA), and cerebral infarction without residual deficits: Secondary | ICD-10-CM

## 2014-12-15 DIAGNOSIS — J449 Chronic obstructive pulmonary disease, unspecified: Secondary | ICD-10-CM | POA: Diagnosis present

## 2014-12-15 DIAGNOSIS — I249 Acute ischemic heart disease, unspecified: Secondary | ICD-10-CM | POA: Diagnosis present

## 2014-12-15 DIAGNOSIS — F17210 Nicotine dependence, cigarettes, uncomplicated: Secondary | ICD-10-CM | POA: Diagnosis present

## 2014-12-15 DIAGNOSIS — E119 Type 2 diabetes mellitus without complications: Secondary | ICD-10-CM | POA: Diagnosis present

## 2014-12-15 DIAGNOSIS — Z8679 Personal history of other diseases of the circulatory system: Secondary | ICD-10-CM

## 2014-12-15 DIAGNOSIS — I10 Essential (primary) hypertension: Secondary | ICD-10-CM | POA: Diagnosis present

## 2014-12-15 LAB — CBC WITH DIFFERENTIAL/PLATELET
BASOS ABS: 0 10*3/uL (ref 0.0–0.1)
BASOS PCT: 1 %
Eosinophils Absolute: 0.1 10*3/uL (ref 0.0–0.7)
Eosinophils Relative: 2 %
HEMATOCRIT: 36.7 % (ref 36.0–46.0)
Hemoglobin: 12 g/dL (ref 12.0–15.0)
LYMPHS PCT: 57 %
Lymphs Abs: 4.5 10*3/uL — ABNORMAL HIGH (ref 0.7–4.0)
MCH: 27.3 pg (ref 26.0–34.0)
MCHC: 32.7 g/dL (ref 30.0–36.0)
MCV: 83.4 fL (ref 78.0–100.0)
Monocytes Absolute: 0.6 10*3/uL (ref 0.1–1.0)
Monocytes Relative: 8 %
NEUTROS ABS: 2.4 10*3/uL (ref 1.7–7.7)
NEUTROS PCT: 32 %
Platelets: 206 10*3/uL (ref 150–400)
RBC: 4.4 MIL/uL (ref 3.87–5.11)
RDW: 17 % — AB (ref 11.5–15.5)
WBC: 7.8 10*3/uL (ref 4.0–10.5)

## 2014-12-15 LAB — COMPREHENSIVE METABOLIC PANEL
ALT: 12 U/L — ABNORMAL LOW (ref 14–54)
ANION GAP: 7 (ref 5–15)
AST: 23 U/L (ref 15–41)
Albumin: 3.9 g/dL (ref 3.5–5.0)
Alkaline Phosphatase: 103 U/L (ref 38–126)
BILIRUBIN TOTAL: 0.7 mg/dL (ref 0.3–1.2)
BUN: 10 mg/dL (ref 6–20)
CHLORIDE: 103 mmol/L (ref 101–111)
CO2: 29 mmol/L (ref 22–32)
Calcium: 9.5 mg/dL (ref 8.9–10.3)
Creatinine, Ser: 0.74 mg/dL (ref 0.44–1.00)
Glucose, Bld: 107 mg/dL — ABNORMAL HIGH (ref 65–99)
POTASSIUM: 3.9 mmol/L (ref 3.5–5.1)
Sodium: 139 mmol/L (ref 135–145)
TOTAL PROTEIN: 6.6 g/dL (ref 6.5–8.1)

## 2014-12-15 LAB — GLUCOSE, CAPILLARY: Glucose-Capillary: 163 mg/dL — ABNORMAL HIGH (ref 65–99)

## 2014-12-15 LAB — TROPONIN I

## 2014-12-15 MED ORDER — ALBUTEROL SULFATE (2.5 MG/3ML) 0.083% IN NEBU
2.5000 mg | INHALATION_SOLUTION | Freq: Four times a day (QID) | RESPIRATORY_TRACT | Status: DC | PRN
  Filled 2014-12-15: qty 3

## 2014-12-15 MED ORDER — ASPIRIN EC 81 MG PO TBEC
81.0000 mg | DELAYED_RELEASE_TABLET | Freq: Every day | ORAL | Status: DC
  Administered 2014-12-16 – 2014-12-18 (×4): 81 mg via ORAL
  Filled 2014-12-15 (×4): qty 1

## 2014-12-15 MED ORDER — HEPARIN BOLUS VIA INFUSION
4000.0000 [IU] | Freq: Once | INTRAVENOUS | Status: AC
  Administered 2014-12-15: 4000 [IU] via INTRAVENOUS
  Filled 2014-12-15: qty 4000

## 2014-12-15 MED ORDER — ACETAMINOPHEN 325 MG PO TABS
650.0000 mg | ORAL_TABLET | ORAL | Status: DC | PRN
  Administered 2014-12-15 – 2014-12-16 (×2): 650 mg via ORAL
  Filled 2014-12-15 (×2): qty 2

## 2014-12-15 MED ORDER — ONDANSETRON HCL 4 MG/2ML IJ SOLN
4.0000 mg | Freq: Four times a day (QID) | INTRAMUSCULAR | Status: DC | PRN
  Administered 2014-12-17: 4 mg via INTRAVENOUS
  Filled 2014-12-15: qty 2

## 2014-12-15 MED ORDER — ASPIRIN 300 MG RE SUPP
300.0000 mg | RECTAL | Status: AC
  Filled 2014-12-15: qty 1

## 2014-12-15 MED ORDER — ATORVASTATIN CALCIUM 40 MG PO TABS
40.0000 mg | ORAL_TABLET | Freq: Every day | ORAL | Status: DC
  Administered 2014-12-16 – 2014-12-18 (×3): 40 mg via ORAL
  Filled 2014-12-15 (×3): qty 1

## 2014-12-15 MED ORDER — NITROGLYCERIN 0.4 MG SL SUBL: 0.4000 mg | SUBLINGUAL_TABLET | SUBLINGUAL | Status: DC | PRN

## 2014-12-15 MED ORDER — ASPIRIN 81 MG PO CHEW
324.0000 mg | CHEWABLE_TABLET | ORAL | Status: AC
  Administered 2014-12-15: 324 mg via ORAL
  Filled 2014-12-15: qty 4

## 2014-12-15 MED ORDER — LOSARTAN POTASSIUM 50 MG PO TABS
100.0000 mg | ORAL_TABLET | Freq: Every day | ORAL | Status: DC
  Administered 2014-12-15 – 2014-12-18 (×4): 100 mg via ORAL
  Filled 2014-12-15 (×4): qty 2

## 2014-12-15 MED ORDER — ALPRAZOLAM 0.25 MG PO TABS: 0.2500 mg | ORAL_TABLET | Freq: Two times a day (BID) | ORAL | Status: DC | PRN

## 2014-12-15 MED ORDER — SODIUM CHLORIDE 0.9 % IJ SOLN: 3.0000 mL | INTRAMUSCULAR | Status: DC | PRN

## 2014-12-15 MED ORDER — SODIUM CHLORIDE 0.9 % IV SOLN: 250.0000 mL | INTRAVENOUS | Status: DC | PRN

## 2014-12-15 MED ORDER — METFORMIN HCL 500 MG PO TABS
1000.0000 mg | ORAL_TABLET | Freq: Two times a day (BID) | ORAL | Status: DC
  Administered 2014-12-16 – 2014-12-18 (×4): 1000 mg via ORAL
  Filled 2014-12-15 (×4): qty 2

## 2014-12-15 MED ORDER — HEPARIN (PORCINE) IN NACL 100-0.45 UNIT/ML-% IJ SOLN
800.0000 [IU]/h | INTRAMUSCULAR | Status: DC
  Administered 2014-12-15: 950 [IU]/h via INTRAVENOUS
  Administered 2014-12-18: 800 [IU]/h via INTRAVENOUS
  Filled 2014-12-15 (×3): qty 250

## 2014-12-15 MED ORDER — NITROGLYCERIN 0.4 MG/HR TD PT24
0.4000 mg | MEDICATED_PATCH | Freq: Every day | TRANSDERMAL | Status: DC
  Administered 2014-12-16 – 2014-12-17 (×2): 0.4 mg via TRANSDERMAL
  Filled 2014-12-15 (×3): qty 1

## 2014-12-15 MED ORDER — SODIUM CHLORIDE 0.9 % IJ SOLN
3.0000 mL | Freq: Two times a day (BID) | INTRAMUSCULAR | Status: DC
  Administered 2014-12-15 – 2014-12-16 (×2): 3 mL via INTRAVENOUS

## 2014-12-15 MED ORDER — METOPROLOL SUCCINATE ER 100 MG PO TB24
100.0000 mg | ORAL_TABLET | Freq: Every day | ORAL | Status: DC
Start: 2014-12-15 — End: 2014-12-18
  Administered 2014-12-16 – 2014-12-18 (×3): 100 mg via ORAL
  Filled 2014-12-15 (×3): qty 1
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
## 2014-12-16 ENCOUNTER — Observation Stay (HOSPITAL_COMMUNITY): Payer: Medicare Other

## 2014-12-16 LAB — BASIC METABOLIC PANEL WITH GFR
Anion gap: 8 (ref 5–15)
BUN: 10 mg/dL (ref 6–20)
CO2: 27 mmol/L (ref 22–32)
Calcium: 8.9 mg/dL (ref 8.9–10.3)
Chloride: 102 mmol/L (ref 101–111)
Creatinine, Ser: 0.69 mg/dL (ref 0.44–1.00)
GFR calc Af Amer: >60 mL/min
GFR calc non Af Amer: >60 mL/min
Glucose, Bld: 121 mg/dL — ABNORMAL HIGH (ref 65–99)
Potassium: 3.5 mmol/L (ref 3.5–5.1)
Sodium: 137 mmol/L (ref 135–145)

## 2014-12-16 LAB — CBC
HCT: 35.8 % — ABNORMAL LOW (ref 36.0–46.0)
Hemoglobin: 11.4 g/dL — ABNORMAL LOW (ref 12.0–15.0)
MCH: 26.8 pg (ref 26.0–34.0)
MCHC: 31.8 g/dL (ref 30.0–36.0)
MCV: 84.2 fL (ref 78.0–100.0)
Platelets: 194 10*3/uL (ref 150–400)
RBC: 4.25 MIL/uL (ref 3.87–5.11)
RDW: 17.1 % — ABNORMAL HIGH (ref 11.5–15.5)
WBC: 8.2 10*3/uL (ref 4.0–10.5)

## 2014-12-16 LAB — HEPARIN LEVEL (UNFRACTIONATED)
HEPARIN UNFRACTIONATED: 0.82 [IU]/mL — AB (ref 0.30–0.70)
HEPARIN UNFRACTIONATED: 0.53 [IU]/mL (ref 0.30–0.70)
Heparin Unfractionated: 0.45 IU/mL (ref 0.30–0.70)

## 2014-12-16 LAB — GLUCOSE, CAPILLARY
GLUCOSE-CAPILLARY: 112 mg/dL — AB (ref 65–99)
GLUCOSE-CAPILLARY: 118 mg/dL — AB (ref 65–99)
Glucose-Capillary: 146 mg/dL — ABNORMAL HIGH (ref 65–99)
Glucose-Capillary: 172 mg/dL — ABNORMAL HIGH (ref 65–99)

## 2014-12-16 LAB — LIPID PANEL
Cholesterol: 117 mg/dL (ref 0–200)
HDL: 55 mg/dL
LDL Cholesterol: 49 mg/dL (ref 0–99)
Total CHOL/HDL Ratio: 2.1 ratio
Triglycerides: 67 mg/dL
VLDL: 13 mg/dL (ref 0–40)

## 2014-12-16 LAB — TROPONIN I
Troponin I: <0.03 ng/mL
Troponin I: <0.03 ng/mL

## 2014-12-16 LAB — PROTIME-INR
INR: 1.05 (ref 0.00–1.49)
Prothrombin Time: 13.9 s (ref 11.6–15.2)

## 2014-12-16 MED ORDER — TECHNETIUM TC 99M SESTAMIBI GENERIC - CARDIOLITE
10.0000 | Freq: Once | INTRAVENOUS | Status: AC | PRN
  Administered 2014-12-16: 10 via INTRAVENOUS

## 2014-12-16 MED ORDER — TECHNETIUM TC 99M SESTAMIBI - CARDIOLITE
30.0000 | Freq: Once | INTRAVENOUS | Status: AC | PRN
  Administered 2014-12-16: 08:00:00 30 via INTRAVENOUS

## 2014-12-16 MED ORDER — REGADENOSON 0.4 MG/5ML IV SOLN
INTRAVENOUS | Status: AC
  Filled 2014-12-16: qty 5

## 2014-12-16 MED ORDER — INSULIN ASPART 100 UNIT/ML ~~LOC~~ SOLN
0.0000 [IU] | Freq: Three times a day (TID) | SUBCUTANEOUS | Status: DC
  Administered 2014-12-16: 3 [IU] via SUBCUTANEOUS
  Administered 2014-12-17: 2 [IU] via SUBCUTANEOUS
  Administered 2014-12-18: 3 [IU] via SUBCUTANEOUS

## 2014-12-16 MED ORDER — OXYCODONE HCL 5 MG PO TABS
5.0000 mg | ORAL_TABLET | ORAL | Status: DC | PRN
  Administered 2014-12-16 – 2014-12-18 (×4): 5 mg via ORAL
  Filled 2014-12-16 (×5): qty 1

## 2014-12-16 MED ORDER — REGADENOSON 0.4 MG/5ML IV SOLN
0.4000 mg | Freq: Once | INTRAVENOUS | Status: DC
Start: 2014-12-16 — End: 2014-12-18
  Filled 2014-12-16: qty 5

## 2014-12-16 MED ORDER — CYCLOBENZAPRINE HCL 5 MG PO TABS
5.0000 mg | ORAL_TABLET | Freq: Once | ORAL | Status: AC
  Administered 2014-12-16: 5 mg via ORAL
  Filled 2014-12-16: qty 1
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
## 2014-12-17 ENCOUNTER — Other Ambulatory Visit: Payer: Self-pay

## 2014-12-17 DIAGNOSIS — R072 Precordial pain: Secondary | ICD-10-CM | POA: Diagnosis present

## 2014-12-17 DIAGNOSIS — I25110 Atherosclerotic heart disease of native coronary artery with unstable angina pectoris: Secondary | ICD-10-CM | POA: Diagnosis present

## 2014-12-17 DIAGNOSIS — E119 Type 2 diabetes mellitus without complications: Secondary | ICD-10-CM | POA: Diagnosis present

## 2014-12-17 DIAGNOSIS — J449 Chronic obstructive pulmonary disease, unspecified: Secondary | ICD-10-CM | POA: Diagnosis present

## 2014-12-17 DIAGNOSIS — I10 Essential (primary) hypertension: Secondary | ICD-10-CM | POA: Diagnosis present

## 2014-12-17 DIAGNOSIS — F17210 Nicotine dependence, cigarettes, uncomplicated: Secondary | ICD-10-CM | POA: Diagnosis present

## 2014-12-17 DIAGNOSIS — Z8673 Personal history of transient ischemic attack (TIA), and cerebral infarction without residual deficits: Secondary | ICD-10-CM | POA: Diagnosis not present

## 2014-12-17 DIAGNOSIS — Z8679 Personal history of other diseases of the circulatory system: Secondary | ICD-10-CM | POA: Diagnosis not present

## 2014-12-17 LAB — CBC
HCT: 37.2 % (ref 36.0–46.0)
HEMATOCRIT: 35.5 % — AB (ref 36.0–46.0)
HEMOGLOBIN: 12.3 g/dL (ref 12.0–15.0)
Hemoglobin: 11.5 g/dL — ABNORMAL LOW (ref 12.0–15.0)
MCH: 27.4 pg (ref 26.0–34.0)
MCH: 27.8 pg (ref 26.0–34.0)
MCHC: 32.4 g/dL (ref 30.0–36.0)
MCHC: 33.1 g/dL (ref 30.0–36.0)
MCV: 84.7 fL (ref 78.0–100.0)
MCV: 84 fL (ref 78.0–100.0)
PLATELETS: 225 10*3/uL (ref 150–400)
Platelets: 192 10*3/uL (ref 150–400)
RBC: 4.19 MIL/uL (ref 3.87–5.11)
RBC: 4.43 MIL/uL (ref 3.87–5.11)
RDW: 17.3 % — ABNORMAL HIGH (ref 11.5–15.5)
RDW: 17.2 % — ABNORMAL HIGH (ref 11.5–15.5)
WBC: 7.6 10*3/uL (ref 4.0–10.5)
WBC: 8 10*3/uL (ref 4.0–10.5)

## 2014-12-17 LAB — PROTIME-INR
INR: 1.02 (ref 0.00–1.49)
PROTHROMBIN TIME: 13.6 s (ref 11.6–15.2)

## 2014-12-17 LAB — GLUCOSE, CAPILLARY
GLUCOSE-CAPILLARY: 106 mg/dL — AB (ref 65–99)
GLUCOSE-CAPILLARY: 128 mg/dL — AB (ref 65–99)
GLUCOSE-CAPILLARY: 133 mg/dL — AB (ref 65–99)
Glucose-Capillary: 120 mg/dL — ABNORMAL HIGH (ref 65–99)

## 2014-12-17 LAB — BASIC METABOLIC PANEL
ANION GAP: 7 (ref 5–15)
BUN: 7 mg/dL (ref 6–20)
CHLORIDE: 101 mmol/L (ref 101–111)
CO2: 31 mmol/L (ref 22–32)
Calcium: 9.7 mg/dL (ref 8.9–10.3)
Creatinine, Ser: 0.71 mg/dL (ref 0.44–1.00)
GFR calc Af Amer: >60 mL/min (ref >60)
Glucose, Bld: 173 mg/dL — ABNORMAL HIGH (ref 65–99)
POTASSIUM: 3.8 mmol/L (ref 3.5–5.1)
SODIUM: 139 mmol/L (ref 135–145)

## 2014-12-17 LAB — HEPARIN LEVEL (UNFRACTIONATED): Heparin Unfractionated: 0.47 IU/mL (ref 0.30–0.70)

## 2014-12-17 MED ORDER — SODIUM CHLORIDE 0.9 % IV SOLN
INTRAVENOUS | Status: DC
  Administered 2014-12-18: 06:00:00 via INTRAVENOUS

## 2014-12-17 MED ORDER — SODIUM CHLORIDE 0.9 % IJ SOLN: 3.0000 mL | INTRAMUSCULAR | Status: DC | PRN

## 2014-12-17 MED ORDER — CYCLOBENZAPRINE HCL 5 MG PO TABS
5.0000 mg | ORAL_TABLET | Freq: Once | ORAL | Status: AC
  Administered 2014-12-17: 5 mg via ORAL
  Filled 2014-12-17: qty 1

## 2014-12-17 MED ORDER — SODIUM CHLORIDE 0.9 % IJ SOLN
3.0000 mL | Freq: Two times a day (BID) | INTRAMUSCULAR | Status: DC
  Administered 2014-12-18: 3 mL via INTRAVENOUS

## 2014-12-17 MED ORDER — POTASSIUM CHLORIDE ER 10 MEQ PO TBCR
10.0000 meq | EXTENDED_RELEASE_TABLET | Freq: Two times a day (BID) | ORAL | Status: DC
  Administered 2014-12-17 – 2014-12-18 (×3): 10 meq via ORAL
  Filled 2014-12-17 (×6): qty 1

## 2014-12-17 MED ORDER — SODIUM CHLORIDE 0.9 % IV SOLN: 250.0000 mL | INTRAVENOUS | Status: DC | PRN
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
## 2014-12-18 ENCOUNTER — Encounter (HOSPITAL_COMMUNITY)
Admission: AD | Disposition: A | Discharge status: Home / Self Care | Payer: Self-pay | Source: Ambulatory Visit | Attending: Cardiovascular Disease

## 2014-12-18 ENCOUNTER — Encounter (HOSPITAL_COMMUNITY): Payer: Self-pay | Admitting: Cardiovascular Disease

## 2014-12-18 HISTORY — PX: CARDIAC CATHETERIZATION: SHX172

## 2014-12-18 LAB — GLUCOSE, CAPILLARY
GLUCOSE-CAPILLARY: 86 mg/dL (ref 65–99)
GLUCOSE-CAPILLARY: 186 mg/dL — AB (ref 65–99)
GLUCOSE-CAPILLARY: 105 mg/dL — AB (ref 65–99)
Glucose-Capillary: 112 mg/dL — ABNORMAL HIGH (ref 65–99)

## 2014-12-18 LAB — CBC
HEMATOCRIT: 34.7 % — AB (ref 36.0–46.0)
HEMOGLOBIN: 11.4 g/dL — AB (ref 12.0–15.0)
MCH: 27.7 pg (ref 26.0–34.0)
MCHC: 32.9 g/dL (ref 30.0–36.0)
MCV: 84.4 fL (ref 78.0–100.0)
Platelets: 187 10*3/uL (ref 150–400)
RBC: 4.11 MIL/uL (ref 3.87–5.11)
RDW: 17.3 % — ABNORMAL HIGH (ref 11.5–15.5)
WBC: 8.3 10*3/uL (ref 4.0–10.5)

## 2014-12-18 LAB — POCT ACTIVATED CLOTTING TIME: Activated Clotting Time: 135 seconds

## 2014-12-18 LAB — HEPARIN LEVEL (UNFRACTIONATED): Heparin Unfractionated: 0.36 IU/mL (ref 0.30–0.70)

## 2014-12-18 SURGERY — LEFT HEART CATH AND CORONARY ANGIOGRAPHY
Anesthesia: LOCAL

## 2014-12-18 MED ORDER — IOHEXOL 350 MG/ML SOLN
INTRAVENOUS | Status: DC | PRN
  Administered 2014-12-18: 45 mL via INTRAVENOUS

## 2014-12-18 MED ORDER — FENTANYL CITRATE (PF) 100 MCG/2ML IJ SOLN
INTRAMUSCULAR | Status: AC
  Filled 2014-12-18: qty 4

## 2014-12-18 MED ORDER — SODIUM CHLORIDE 0.9 % IV SOLN: 250.0000 mL | INTRAVENOUS | Status: DC | PRN

## 2014-12-18 MED ORDER — OXYCODONE HCL 5 MG PO TABS: 5.0000 mg | ORAL_TABLET | Freq: Every day | ORAL | Status: AC

## 2014-12-18 MED ORDER — METFORMIN HCL 1000 MG PO TABS: 1000.0000 mg | ORAL_TABLET | Freq: Two times a day (BID) | ORAL | Status: AC

## 2014-12-18 MED ORDER — POTASSIUM CHLORIDE ER 10 MEQ PO TBCR: 10.0000 meq | EXTENDED_RELEASE_TABLET | Freq: Every day | ORAL | Status: AC

## 2014-12-18 MED ORDER — SODIUM CHLORIDE 0.9 % IJ SOLN: 3.0000 mL | INTRAMUSCULAR | Status: DC | PRN

## 2014-12-18 MED ORDER — SODIUM CHLORIDE 0.9 % IV SOLN: INTRAVENOUS | Status: AC

## 2014-12-18 MED ORDER — FENTANYL CITRATE (PF) 100 MCG/2ML IJ SOLN
INTRAMUSCULAR | Status: DC | PRN
  Administered 2014-12-18: 25 ug via INTRAVENOUS

## 2014-12-18 MED ORDER — LIDOCAINE HCL (PF) 1 % IJ SOLN
INTRAMUSCULAR | Status: AC
  Filled 2014-12-18: qty 30

## 2014-12-18 MED ORDER — GLIPIZIDE ER 5 MG PO TB24: 5.0000 mg | ORAL_TABLET | Freq: Every day | ORAL | Status: AC

## 2014-12-18 MED ORDER — MIDAZOLAM HCL 2 MG/2ML IJ SOLN
INTRAMUSCULAR | Status: DC | PRN
  Administered 2014-12-18: 1 mg via INTRAVENOUS

## 2014-12-18 MED ORDER — ASPIRIN 81 MG PO TBEC: 81.0000 mg | DELAYED_RELEASE_TABLET | Freq: Every day | ORAL | Status: AC

## 2014-12-18 MED ORDER — ALPRAZOLAM 0.25 MG PO TABS: 0.2500 mg | ORAL_TABLET | Freq: Every evening | ORAL | Status: AC | PRN

## 2014-12-18 MED ORDER — SODIUM CHLORIDE 0.9 % IJ SOLN
3.0000 mL | Freq: Two times a day (BID) | INTRAMUSCULAR | Status: DC
  Administered 2014-12-18: 3 mL via INTRAVENOUS

## 2014-12-18 MED ORDER — LIDOCAINE HCL (PF) 1 % IJ SOLN
INTRAMUSCULAR | Status: DC | PRN
  Administered 2014-12-18: 09:00:00

## 2014-12-18 MED ORDER — MIDAZOLAM HCL 2 MG/2ML IJ SOLN
INTRAMUSCULAR | Status: AC
  Filled 2014-12-18: qty 4

## 2014-12-18 SURGICAL SUPPLY — 7 items
CATH INFINITI 5FR MULTPACK ANG (CATHETERS) ×2 IMPLANT
KIT HEART LEFT (KITS) ×2 IMPLANT
PACK CARDIAC CATHETERIZATION (CUSTOM PROCEDURE TRAY) ×2 IMPLANT
SHEATH PINNACLE 5F 10CM (SHEATH) ×2 IMPLANT
SYR MEDRAD MARK V 150ML (SYRINGE) ×2 IMPLANT
TRANSDUCER W/STOPCOCK (MISCELLANEOUS) ×2 IMPLANT
WIRE EMERALD 3MM-J .035X150CM (WIRE) ×2 IMPLANT
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
## 2014-12-18 NOTE — Progress Notes (Signed)
Discharge instructions given. Pt verbalized understanding and all questions were answered.  

## 2014-12-18 NOTE — Progress Notes (Signed)
Site area: right groin a 5 french sheath was removed  Site Prior to Removal:  Level 0  Pressure Applied For 15 MINUTES    Minutes Beginning at 1000am  Manual:   Yes.    Patient Status During Pull:  stable  Post Pull Groin Site:  Level 0  Post Pull Instructions Given:  Yes.    Post Pull Pulses Present:  Yes.    Dressing Applied:  Yes.    Comments:  VS remain stable during sheath pull.  Pt resting with eyes closed.  No acute distress noted.

## 2014-12-18 NOTE — Interval H&P Note (Signed)
History and Physical Interval Note:  12/18/2014 8:59 AM  Merci P Pringle  has presented today for surgery, with the diagnosis of CAD, Angina  The various methods of treatment have been discussed with the patient and family. After consideration of risks, benefits and other options for treatment, the patient has consented to  Procedure(s): Left Heart Cath and Coronary Angiography (N/A) as a surgical intervention .  The patient's history has been reviewed, patient examined, no change in status, stable for surgery.  I have reviewed the patient's chart and labs.  Questions were answered to the patient's satisfaction.     KADAKIA,AJAY S
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
## 2015-03-21 ENCOUNTER — Encounter (INDEPENDENT_AMBULATORY_CARE_PROVIDER_SITE_OTHER): Payer: Medicare Other | Admitting: Ophthalmology

## 2015-03-21 DIAGNOSIS — H35033 Hypertensive retinopathy, bilateral: Secondary | ICD-10-CM | POA: Diagnosis not present

## 2015-03-21 DIAGNOSIS — E113293 Type 2 diabetes mellitus with mild nonproliferative diabetic retinopathy without macular edema, bilateral: Secondary | ICD-10-CM | POA: Diagnosis not present

## 2015-03-21 DIAGNOSIS — H35342 Macular cyst, hole, or pseudohole, left eye: Secondary | ICD-10-CM

## 2015-03-21 DIAGNOSIS — E11319 Type 2 diabetes mellitus with unspecified diabetic retinopathy without macular edema: Secondary | ICD-10-CM

## 2015-03-21 DIAGNOSIS — H43813 Vitreous degeneration, bilateral: Secondary | ICD-10-CM | POA: Diagnosis not present

## 2015-03-21 DIAGNOSIS — I10 Essential (primary) hypertension: Secondary | ICD-10-CM

## 2015-12-24 ENCOUNTER — Ambulatory Visit (INDEPENDENT_AMBULATORY_CARE_PROVIDER_SITE_OTHER): Payer: Medicare Other | Admitting: Ophthalmology

## 2016-07-25 ENCOUNTER — Other Ambulatory Visit: Payer: Self-pay | Admitting: Family Medicine

## 2016-07-25 DIAGNOSIS — R52 Pain, unspecified: Secondary | ICD-10-CM

## 2016-07-30 ENCOUNTER — Other Ambulatory Visit: Payer: Self-pay | Admitting: Family Medicine

## 2016-07-30 DIAGNOSIS — R109 Unspecified abdominal pain: Secondary | ICD-10-CM

## 2016-08-01 ENCOUNTER — Other Ambulatory Visit: Payer: Medicare Other

## 2016-08-05 ENCOUNTER — Ambulatory Visit
Admission: RE | Admit: 2016-08-05 | Discharge: 2016-08-05 | Disposition: A | Discharge status: Home / Self Care | Payer: Medicare Other | Source: Ambulatory Visit | Attending: Family Medicine | Admitting: Family Medicine

## 2016-08-05 DIAGNOSIS — R109 Unspecified abdominal pain: Secondary | ICD-10-CM

## 2016-08-05 MED ORDER — IOPAMIDOL (ISOVUE-300) INJECTION 61%
100.0000 mL | Freq: Once | INTRAVENOUS | Status: AC | PRN
  Administered 2016-08-05: 100 mL via INTRAVENOUS

## 2016-08-08 ENCOUNTER — Encounter: Payer: Self-pay | Admitting: Nurse Practitioner

## 2016-08-20 ENCOUNTER — Other Ambulatory Visit (INDEPENDENT_AMBULATORY_CARE_PROVIDER_SITE_OTHER): Payer: Medicare Other

## 2016-08-20 ENCOUNTER — Encounter: Payer: Self-pay | Admitting: Nurse Practitioner

## 2016-08-20 ENCOUNTER — Ambulatory Visit (INDEPENDENT_AMBULATORY_CARE_PROVIDER_SITE_OTHER): Payer: Medicare Other | Admitting: Nurse Practitioner

## 2016-08-20 VITALS — BP 138/76 | HR 72 | Ht 65.0 in | Wt 170.4 lb

## 2016-08-20 DIAGNOSIS — K219 Gastro-esophageal reflux disease without esophagitis: Secondary | ICD-10-CM

## 2016-08-20 DIAGNOSIS — Z1211 Encounter for screening for malignant neoplasm of colon: Secondary | ICD-10-CM

## 2016-08-20 DIAGNOSIS — K5909 Other constipation: Secondary | ICD-10-CM

## 2016-08-20 DIAGNOSIS — G8929 Other chronic pain: Secondary | ICD-10-CM

## 2016-08-20 DIAGNOSIS — R1031 Right lower quadrant pain: Secondary | ICD-10-CM

## 2016-08-20 LAB — CBC
HEMATOCRIT: 39.4 % (ref 36.0–46.0)
HEMOGLOBIN: 12.6 g/dL (ref 12.0–15.0)
MCHC: 32.1 g/dL (ref 30.0–36.0)
MCV: 82.2 fl (ref 78.0–100.0)
PLATELETS: 227 10*3/uL (ref 150.0–400.0)
RBC: 4.79 Mil/uL (ref 3.87–5.11)
RDW: 17.1 % — ABNORMAL HIGH (ref 11.5–15.5)
WBC: 5.8 10*3/uL (ref 4.0–10.5)

## 2016-08-20 MED ORDER — NA SULFATE-K SULFATE-MG SULF 17.5-3.13-1.6 GM/177ML PO SOLN: 1.0000 | Freq: Once | ORAL | 0 refills | Status: AC

## 2016-08-20 NOTE — Progress Notes (Addendum)
HPI:  Patient is a 70 year old female with DM2 and hypertension referred by PCP Levin Bacon, NP from [facility] for evaluation of abdominal pain  and heme positive stools. Three weeks ago patient developed acute abdominal bloating and generalized cramping. Symptoms persisted for several days, patient was seen at [facility] on 07/25/16 7 4  CT scan of the abdomen and pelvis with contrast which showed moderate stool and distention of the ascending and transverse colon. There was left-sided diverticular change without diverticulitis. Was a 14 mm right adrenal nodule compatible with adenoma as well as several renal cysts. She was advised to start MiraLAX. She was advised to have a repeat CT scan in 6 months to follow-up on the adrenal adenoma. Labs were apparently done at the visit, I do not have those results.   Patient has occasional constipation but didn't think the pain and bloating were related. Regardless she took the miralax for several days with good results and resolution of symptoms. She hasn't taken the miralax in 1.5 weeks and the pain / bloating have returned. She hasn't seen any blood in her stools. Per patient she had a colonoscopy 10 years ago (sounds like with [facility]). Oral iron on home med list but patient doesn't think she takes it.   Patient has a history of GERD. She has frequent heartburn but only takes nexium every now and then. She doesn't go to bed on an empty stomach.    Past Medical History:  Diagnosis Date  . Anxiety   . COPD (chronic obstructive pulmonary disease) ([place])   . Coronary artery disease   . Diabetes mellitus without complication ([place])   . GERD (gastroesophageal reflux disease)   . Hypertension   . Stroke (cerebrum) ([place])   . Stroke ([place])   . Thyroid disease     Past Surgical History:  Procedure Laterality Date  . ANEURYSM COILING  2007  . CARDIAC CATHETERIZATION N/A 12/18/2014   Procedure: Left Heart Cath and Coronary Angiography;  Surgeon: Dixie Dials, MD;  Location: [facility];  Service: Cardiovascular;  Laterality: N/A;  . CARDIAC SURGERY     Family History  Problem Relation Age of Onset  . Diabetes Mother   . Heart disease Mother   . Diabetes Sister   . Lung cancer Sister    Social History  Substance Use Topics  . Smoking status: Current Every Day Smoker    Packs/day: 0.25    Types: Cigarettes  . Smokeless tobacco: Never Used  . Alcohol use No   Current Outpatient Prescriptions  Medication Sig Dispense Refill  . ALPRAZolam (XANAX) 0.25 MG tablet Take 1 tablet (0.25 mg total) by mouth at bedtime as needed for anxiety. 30 tablet 0  . AMLODIPINE BESYLATE PO Take by mouth.    Marland Kitchen aspirin EC 81 MG EC tablet Take 1 tablet (81 mg total) by mouth daily.    Marland Kitchen atorvastatin (LIPITOR) 40 MG tablet Take 40 mg by mouth daily.  0  . glipiZIDE (GLUCOTROL XL) 5 MG 24 hr tablet Take 1 tablet (5 mg total) by mouth daily with breakfast. May stop medication when start metformin on 12/20/2014 and blood sugar control is very good. If fasting blood sugar is between 120-140 mg. Take 1/2 tablet and if over 141 mg. then take whole tablet 30 tablet 1  . levothyroxine (SYNTHROID, LEVOTHROID) 75 MCG tablet Take 75 mcg by mouth daily before breakfast.    . losartan (COZAAR) 100 MG tablet Take  100 mg by mouth daily.   0  . meclizine (ANTIVERT) 25 MG tablet Take 25 mg by mouth 3 (three) times daily as needed for dizziness.    . metFORMIN (GLUCOPHAGE) 1000 MG tablet Take 1 tablet (1,000 mg total) by mouth 2 (two) times daily. Start from 12/20/2014.  0  . metoprolol succinate (TOPROL-XL) 100 MG 24 hr tablet Take 100 mg by mouth daily.  0  . nitroGLYCERIN (NITRODUR - DOSED IN MG/24 HR) 0.4 mg/hr patch apply 1 patch once daily  0  . oxyCODONE (OXY IR/ROXICODONE) 5 MG immediate release tablet Take 1 tablet (5 mg total) by mouth at bedtime. 30 tablet 0  . potassium chloride (K-DUR) 10 MEQ tablet Take 1 tablet (10 mEq total) by mouth daily. 30 tablet 1    . VENTOLIN HFA 108 (90 BASE) MCG/ACT inhaler Inhale 1 puff into the lungs every 6 (six) hours as needed for shortness of breath.   0   No current facility-administered medications for this visit.    Allergies  Allergen Reactions  . Acyclovir And Related     Review of Systems: Positive for allergy, anxiety, back pain, vision changes, fatigue, heart murmur, nosebleeds, shortness of breath, sleepy pounds, swollen lymph nodes and excessive urination. All other systems reviewed and negative except where noted in HPI.   Physical Exam: BP 138/76   Pulse 72   Ht 5\' 5"  (1.651 m)   Wt 170 lb 6 oz (77.3 kg)   BMI 28.35 kg/m  Constitutional:  Well-developed, black female in no acute distress. Psychiatric: Normal mood and affect. Behavior is normal. EENT:  Conjunctivae are normal. No scleral icterus. Neck supple.  Cardiovascular: Normal rate, regular rhythm.  Pulmonary/chest: Effort normal and breath sounds normal. No wheezing, rales or rhonchi. Abdominal: Soft, mildly distended lower abdomen > RLQ,  nontender. Bowel sounds active throughout. There are no masses palpable. No hepatomegaly. Extremities: no edema Lymphadenopathy: No cervical adenopathy noted. Neurological: Alert and oriented to person place and time. Skin: Skin is warm and dry. No rashes noted.   ASSESSMENT AND PLAN:  10. 71 yo female referred by [facility] for abdominal pain and heme positive stools. Lab results not sent, I will try and obtain but a non-contrast CTscan shows moderate stool and distention of the ascending and transverse colon. Her abdominal pain and bloating improved after having bowel movements on Miralax. She doesn't want to take the miralax everyday because stools were too loose. She stopped miralax 1.5 weeks ago and now with pain and bloating.   -restart Miralax and take for a week to purge bowels. If constipation and pain resolve then titrate the miralax for effect, 2-3 times a week may suffice.  2. Heme  positive stool per referring provider. No overt Gi bleeding.  -Further evaluation at time of colonoscopy. She had recent labs, I have requested those results.     3. Colon cancer screening colonoscopy.  The risks and benefits of the colonoscopy with possible polypectomy were discussed and the patient agrees to proceed.   4. Adrenal adenoma (21mm) on non-contrast CTscan. Follow up imaging to be done by Levin Bacon , NP  5. GERD. Frequent heartburn but takes Nexium sparingly.  -increase Nexium to daily dosing -Anti-reflux measures discussed, GERD literature given   Tye Savoy, NP  08/20/2016, 11:07 AM  Cc:  Levin Bacon, NP  Addendum: Reviewed and agree with initial management. Pyrtle, Lajuan Lines, MD

## 2016-08-20 NOTE — Patient Instructions (Addendum)
If you are age 72 or older, your body mass index should be between 23-30. Your Body mass index is 28.35 kg/m. If this is out of the aforementioned range listed, please consider follow up with your Primary Care Provider.  If you are age 87 or younger, your body mass index should be between 19-25. Your Body mass index is 28.35 kg/m. If this is out of the aformentioned range listed, please consider follow up with your Primary Care Provider.   You have been scheduled for a colonoscopy. Please follow written instructions given to you at your visit today.  Please pick up your prep supplies at the pharmacy within the next 1-3 days. If you use inhalers (even only as needed), please bring them with you on the day of your procedure. Your physician has requested that you go to www.startemmi.com and enter the access code given to you at your visit today. This web site gives a general overview about your procedure. However, you should still follow specific instructions given to you by our office regarding your preparation for the procedure.  Your physician has requested that you go to the basement for the following lab work before leaving today:  CBC  Increase Nexium to daily.  Take Miralax daily for one week, then may titrate to 2-3 times weekly.  You have been given GERD Literature.  Thank you for choosing me and [facility].  Tye Savoy, NP

## 2016-08-31 NOTE — Addendum Note (Signed)
Addended by: Jerene Bears on: 08/31/2016 09:37 PM   Modules accepted: Level of Service

## 2016-09-25 ENCOUNTER — Encounter: Payer: Medicare Other | Admitting: Internal Medicine

## 2016-09-26 ENCOUNTER — Telehealth: Payer: Self-pay

## 2016-09-26 NOTE — Telephone Encounter (Signed)
-----   Message from Willia Craze, NP sent at 09/22/2016  5:41 PM EDT ----- [name], please call [name]. I received labs from [facility] drawn late April (I had requested them). Her lipase was mildly elevated at 105. This may be clinically insignificant but since patient had abdominal pain at that time then I think we need to at least repeat the lipase. Thanks

## 2016-09-26 NOTE — Telephone Encounter (Signed)
Call to the Patient number. No answer. Cannot leave a message. Mailbox is full.

## 2016-09-30 ENCOUNTER — Other Ambulatory Visit: Payer: Self-pay

## 2016-09-30 DIAGNOSIS — R1084 Generalized abdominal pain: Secondary | ICD-10-CM

## 2016-09-30 DIAGNOSIS — R748 Abnormal levels of other serum enzymes: Secondary | ICD-10-CM

## 2016-09-30 NOTE — Telephone Encounter (Signed)
No answer. Mailbox is full and cannot accept messages at this time. The emergency contact (her daughter) phone is d/c. The second contact (grandchild) is not at the number listed and the answering party does not know her.

## 2016-09-30 NOTE — Telephone Encounter (Signed)
Patient saw the number on her phone and called back. She will come by for lab work. She is not certain when. She does not have any abdominal pain now. Taking Miralax daily. Taking Nexium daily.

## 2017-02-28 ENCOUNTER — Other Ambulatory Visit: Payer: Self-pay | Admitting: Family Medicine

## 2017-02-28 DIAGNOSIS — D3500 Benign neoplasm of unspecified adrenal gland: Secondary | ICD-10-CM

## 2019-04-18 ENCOUNTER — Other Ambulatory Visit: Payer: Self-pay

## 2019-04-18 ENCOUNTER — Emergency Department (HOSPITAL_COMMUNITY)
Admission: EM | Admit: 2019-04-18 | Discharge: 2019-04-18 | Disposition: A | Discharge status: Home / Self Care | Payer: Medicare Other | Attending: Emergency Medicine | Admitting: Emergency Medicine

## 2019-04-18 ENCOUNTER — Encounter (HOSPITAL_COMMUNITY): Payer: Self-pay

## 2019-04-18 DIAGNOSIS — R221 Localized swelling, mass and lump, neck: Secondary | ICD-10-CM | POA: Diagnosis not present

## 2019-04-18 DIAGNOSIS — Z5321 Procedure and treatment not carried out due to patient leaving prior to being seen by health care provider: Secondary | ICD-10-CM | POA: Diagnosis not present

## 2019-04-18 NOTE — ED Triage Notes (Signed)
Patient c/o left neck gland swelling x 3 weeks.

## 2019-04-27 ENCOUNTER — Other Ambulatory Visit: Payer: Self-pay | Admitting: Legal Medicine

## 2019-04-27 ENCOUNTER — Other Ambulatory Visit: Payer: Self-pay

## 2019-04-27 DIAGNOSIS — R221 Localized swelling, mass and lump, neck: Secondary | ICD-10-CM

## 2019-04-28 ENCOUNTER — Ambulatory Visit
Admission: RE | Admit: 2019-04-28 | Discharge: 2019-04-28 | Disposition: A | Discharge status: Home / Self Care | Payer: Medicare Other | Source: Ambulatory Visit | Attending: Legal Medicine | Admitting: Legal Medicine

## 2019-04-28 DIAGNOSIS — R221 Localized swelling, mass and lump, neck: Secondary | ICD-10-CM

## 2021-10-14 IMAGING — US US SOFT TISSUE HEAD/NECK
1 series · 13 of 25 positions shown · non-contrast
Comparison: No pertinent prior studies available for comparison.

CLINICAL DATA: Localized swelling, mass and lump, neck. Additional
history provided by scanning technologist: Palpable lump left of
midline submandibular, patient reports "lump" was larger but has now
decreased in size while taking an antibiotic, area is tender to
touch.

EXAM:
ULTRASOUND OF HEAD/NECK SOFT TISSUES
TECHNIQUE: Ultrasound examination of the head and neck soft tissues was
performed in the area of clinical concern.

[Series 1: us soft tissue head/neck · 0.08mm/px · 13 of 27 slices shown]
[im 1/27]
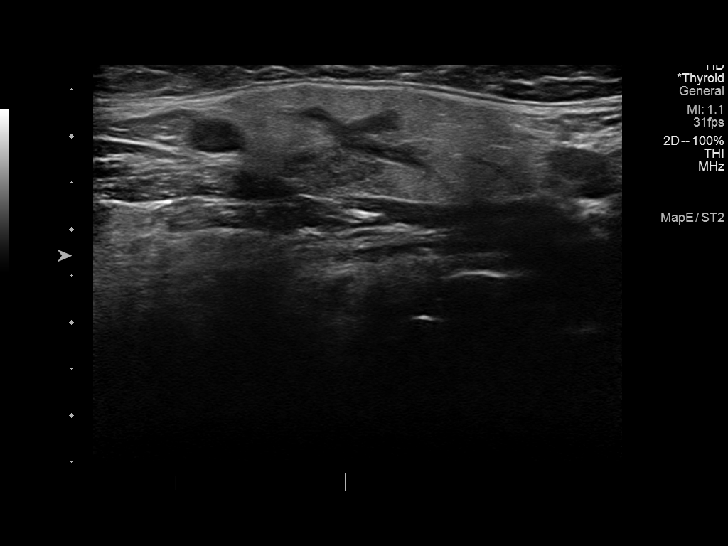
[im 3/27]
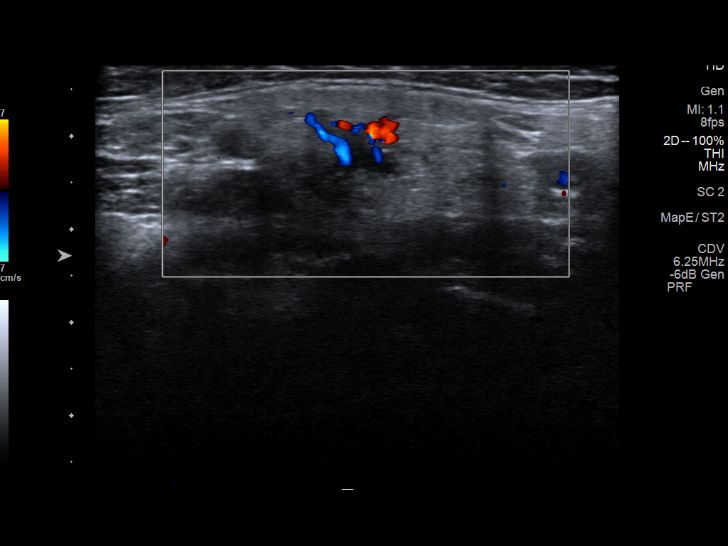
[im 5/27]
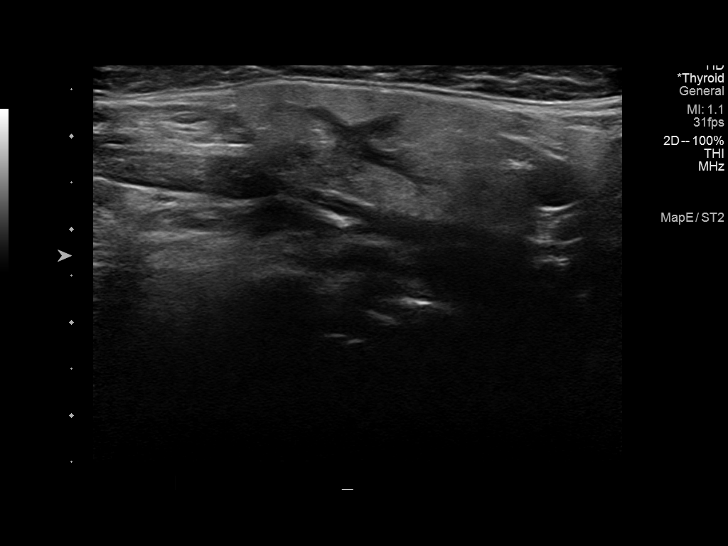
[im 7/27]
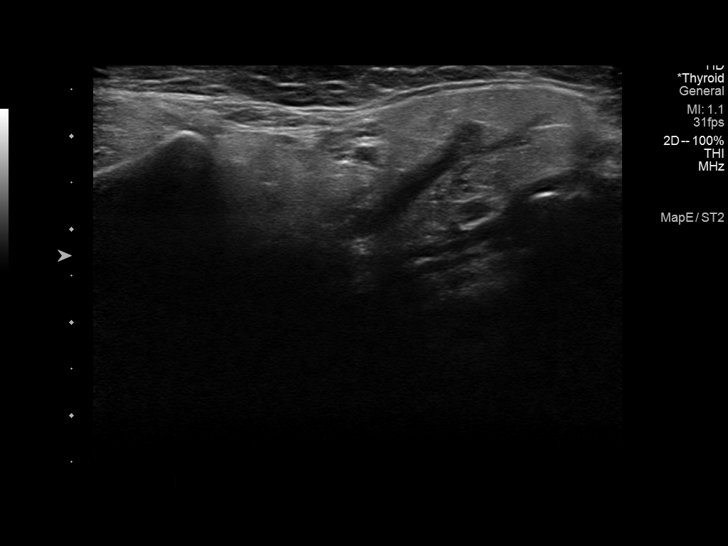
[im 9/27]
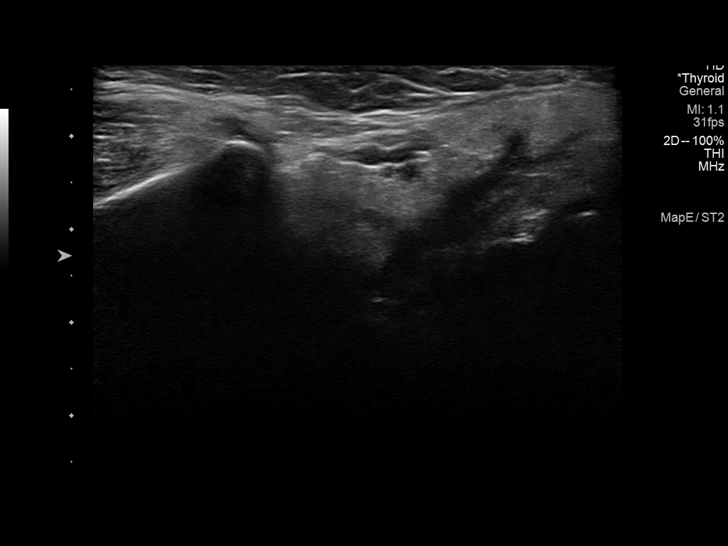
[im 11/27]
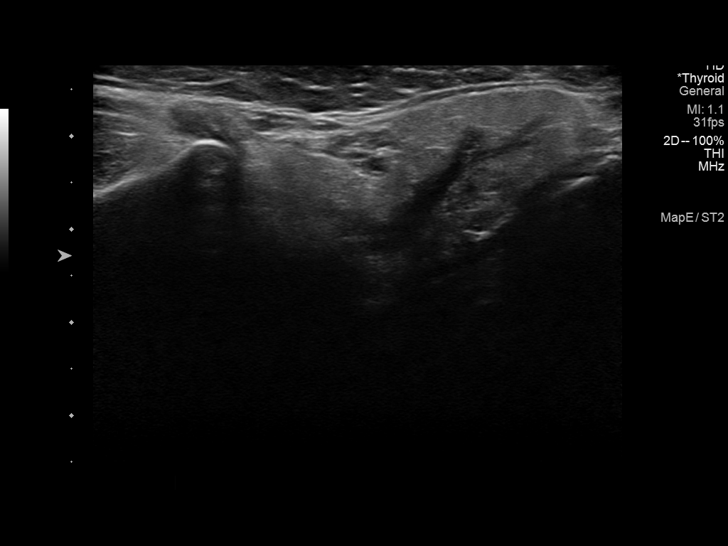
[im 14/27]
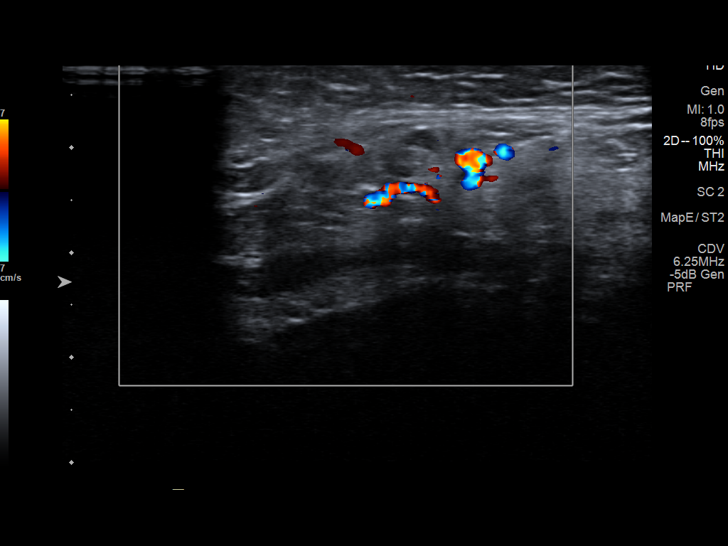
[im 16/27]
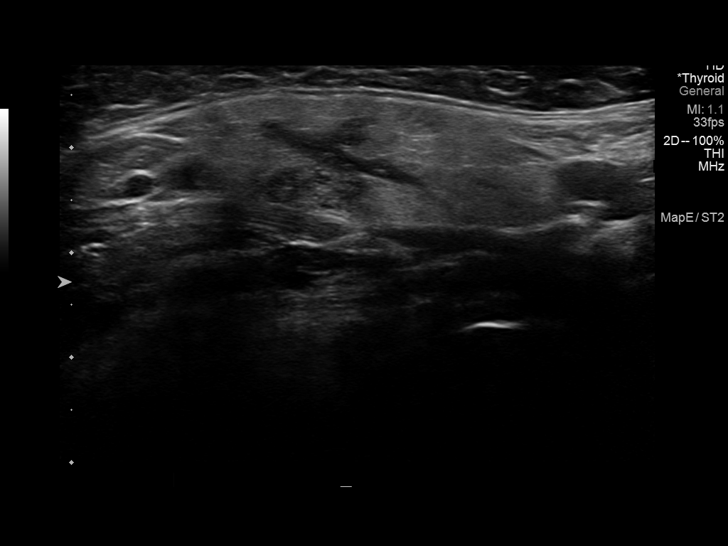
[im 18/27]
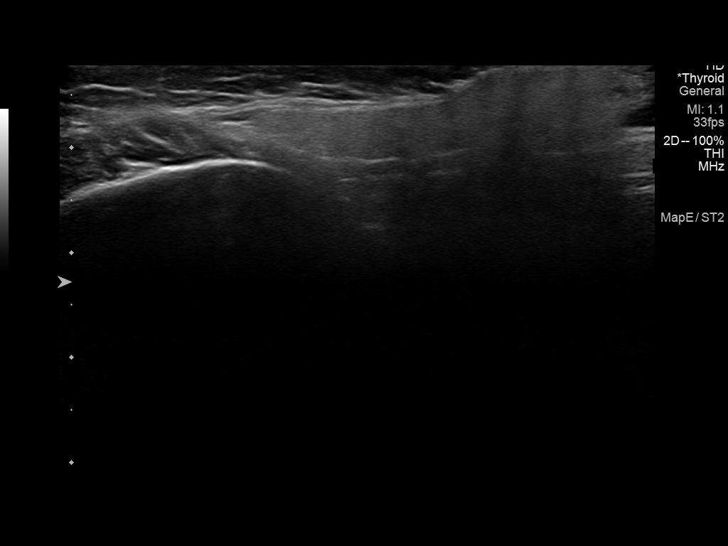
[im 20/27]
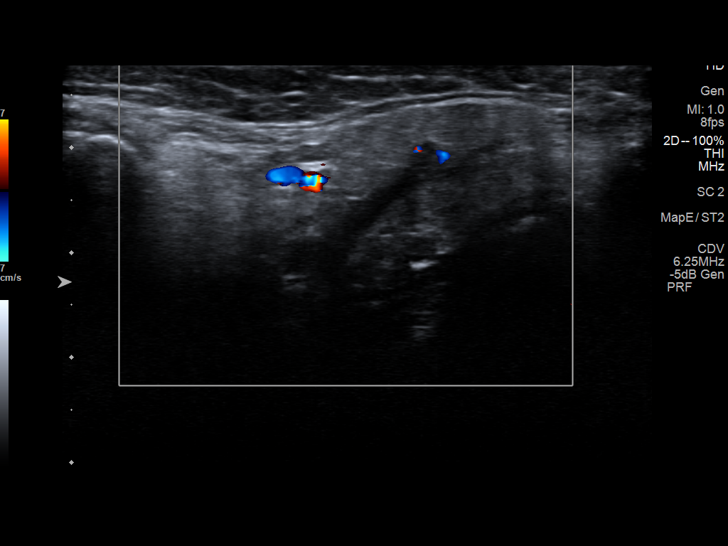
[im 22/27]
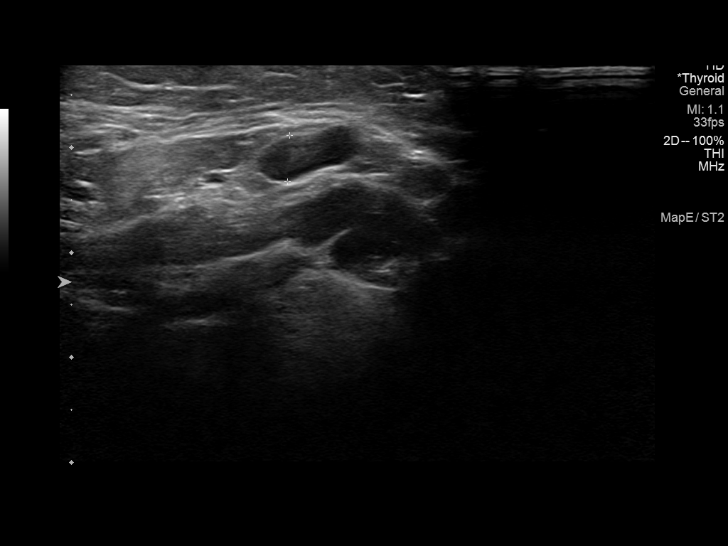
[im 24/27]
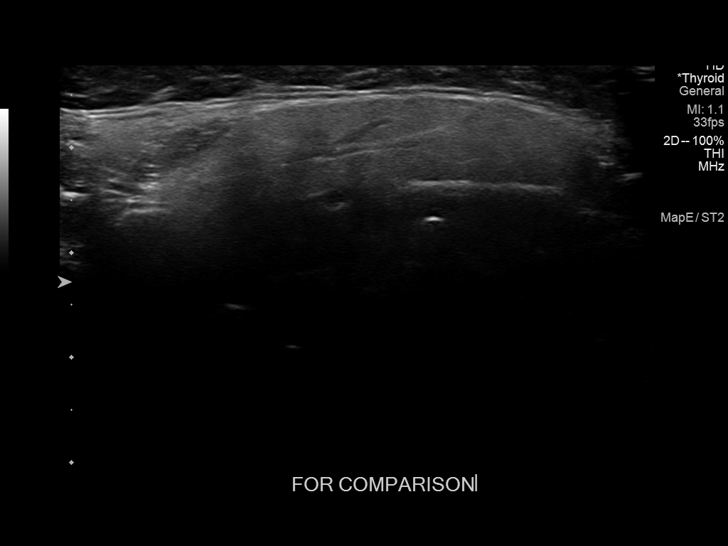
[im 27/27]
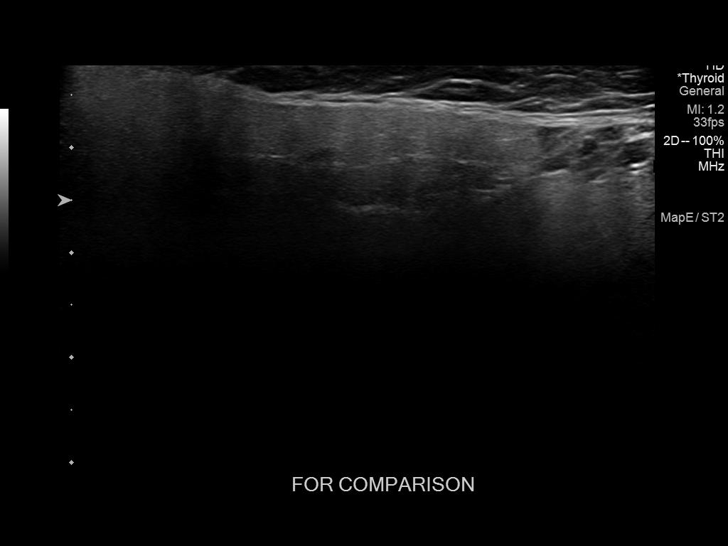

[13 of 25 positions shown; findings below may reference images not displayed]

FINDINGS: Targeted ultrasound in the left submandibular region of concern was
performed. Limited imaging of the contralateral right neck was also
performed for the purposes of comparison. There is prominence of the
ducts within the left submandibular gland. No definite sialolith or
left submandibular gland mass is demonstrated. Two nonenlarged lymph
nodes are demonstrated adjacent to the left submandibular gland
measuring up to 4 mm in short axis.
IMPRESSION: Targeted ultrasound in the left submandibular region of concern
demonstrates left submandibular gland sialectasis. No appreciable
left submandibular gland sialolith or mass. Consider neck CT for
further evaluation, as clinically warranted.

## 2023-10-16 ENCOUNTER — Encounter: Payer: Self-pay | Admitting: Advanced Practice Midwife
